# Patient Record
Sex: Female | Born: 1950 | Race: White | Hispanic: No | Marital: Married | Smoking: Never smoker
Health system: Southern US, Community
[De-identification: ages and names within clinical notes are randomized; demographics above are authoritative.]

## PROBLEM LIST (undated history)

## (undated) DIAGNOSIS — Z87442 Personal history of urinary calculi: Secondary | ICD-10-CM

## (undated) DIAGNOSIS — F909 Attention-deficit hyperactivity disorder, unspecified type: Secondary | ICD-10-CM

## (undated) DIAGNOSIS — J40 Bronchitis, not specified as acute or chronic: Secondary | ICD-10-CM

## (undated) DIAGNOSIS — H1010 Acute atopic conjunctivitis, unspecified eye: Secondary | ICD-10-CM

## (undated) DIAGNOSIS — I341 Nonrheumatic mitral (valve) prolapse: Secondary | ICD-10-CM

## (undated) DIAGNOSIS — J309 Allergic rhinitis, unspecified: Secondary | ICD-10-CM

## (undated) HISTORY — DX: Acute atopic conjunctivitis, unspecified eye: H10.10

## (undated) HISTORY — PX: CATARACT EXTRACTION, BILATERAL: SHX1313

## (undated) HISTORY — DX: Allergic rhinitis, unspecified: J30.9

## (undated) HISTORY — PX: TUBAL LIGATION: SHX77

---

## 2000-12-01 ENCOUNTER — Other Ambulatory Visit: Admission: RE | Admit: 2000-12-01 | Discharge: 2000-12-01 | Payer: Self-pay | Admitting: Internal Medicine

## 2001-11-24 ENCOUNTER — Encounter: Admission: RE | Admit: 2001-11-24 | Discharge: 2001-11-24 | Payer: Self-pay | Admitting: *Deleted

## 2001-12-22 ENCOUNTER — Encounter: Admission: RE | Admit: 2001-12-22 | Discharge: 2001-12-22 | Payer: Self-pay | Admitting: *Deleted

## 2002-01-08 ENCOUNTER — Other Ambulatory Visit: Admission: RE | Admit: 2002-01-08 | Discharge: 2002-01-08 | Payer: Self-pay | Admitting: Obstetrics and Gynecology

## 2002-01-15 ENCOUNTER — Encounter: Payer: Self-pay | Admitting: Obstetrics and Gynecology

## 2002-01-15 ENCOUNTER — Encounter: Admission: RE | Admit: 2002-01-15 | Discharge: 2002-01-15 | Payer: Self-pay | Admitting: Obstetrics and Gynecology

## 2002-03-23 ENCOUNTER — Encounter: Admission: RE | Admit: 2002-03-23 | Discharge: 2002-03-23 | Payer: Self-pay | Admitting: *Deleted

## 2002-07-11 ENCOUNTER — Encounter: Admission: RE | Admit: 2002-07-11 | Discharge: 2002-07-11 | Payer: Self-pay | Admitting: *Deleted

## 2003-04-29 ENCOUNTER — Encounter: Admission: RE | Admit: 2003-04-29 | Discharge: 2003-04-29 | Payer: Self-pay | Admitting: Obstetrics and Gynecology

## 2003-04-29 ENCOUNTER — Encounter: Payer: Self-pay | Admitting: Obstetrics and Gynecology

## 2004-03-30 ENCOUNTER — Ambulatory Visit (HOSPITAL_COMMUNITY): Admission: RE | Admit: 2004-03-30 | Discharge: 2004-03-30 | Payer: Self-pay | Admitting: Vascular Surgery

## 2004-03-30 ENCOUNTER — Encounter (INDEPENDENT_AMBULATORY_CARE_PROVIDER_SITE_OTHER): Payer: Self-pay | Admitting: *Deleted

## 2004-06-19 ENCOUNTER — Encounter: Admission: RE | Admit: 2004-06-19 | Discharge: 2004-06-19 | Payer: Self-pay | Admitting: Internal Medicine

## 2004-11-08 HISTORY — PX: OTHER SURGICAL HISTORY: SHX169

## 2004-11-23 ENCOUNTER — Other Ambulatory Visit: Admission: RE | Admit: 2004-11-23 | Discharge: 2004-11-23 | Payer: Self-pay | Admitting: Internal Medicine

## 2005-01-14 ENCOUNTER — Ambulatory Visit (HOSPITAL_COMMUNITY): Admission: RE | Admit: 2005-01-14 | Discharge: 2005-01-14 | Payer: Self-pay | Admitting: Gastroenterology

## 2005-10-26 ENCOUNTER — Encounter: Admission: RE | Admit: 2005-10-26 | Discharge: 2005-10-26 | Payer: Self-pay | Admitting: Internal Medicine

## 2006-04-06 ENCOUNTER — Ambulatory Visit (HOSPITAL_COMMUNITY): Payer: Self-pay | Admitting: *Deleted

## 2006-06-21 ENCOUNTER — Ambulatory Visit (HOSPITAL_COMMUNITY): Payer: Self-pay | Admitting: *Deleted

## 2006-10-27 ENCOUNTER — Ambulatory Visit (HOSPITAL_COMMUNITY): Payer: Self-pay | Admitting: *Deleted

## 2006-12-05 ENCOUNTER — Encounter: Admission: RE | Admit: 2006-12-05 | Discharge: 2007-03-05 | Payer: Self-pay | Admitting: Family Medicine

## 2008-04-09 ENCOUNTER — Encounter: Admission: RE | Admit: 2008-04-09 | Discharge: 2008-04-09 | Payer: Self-pay | Admitting: Obstetrics and Gynecology

## 2008-06-10 ENCOUNTER — Other Ambulatory Visit: Admission: RE | Admit: 2008-06-10 | Discharge: 2008-06-10 | Payer: Self-pay | Admitting: Family Medicine

## 2010-09-18 ENCOUNTER — Ambulatory Visit: Payer: Self-pay | Admitting: Diagnostic Radiology

## 2010-09-18 ENCOUNTER — Ambulatory Visit (HOSPITAL_BASED_OUTPATIENT_CLINIC_OR_DEPARTMENT_OTHER): Admission: RE | Admit: 2010-09-18 | Discharge: 2010-09-18 | Payer: Self-pay | Admitting: Family Medicine

## 2011-03-26 NOTE — Op Note (Signed)
[name], Alexandria Lee              ACCOUNT NO.:  [id number]   MEDICAL RECORD NO.:  [id number]          PATIENT TYPE:  AMB   LOCATION:  ENDO                         FACILITY:  [facility]   PHYSICIAN:  Danise Edge, M.D.   DATE OF BIRTH:  1951-08-02   DATE OF PROCEDURE:  01/14/2005  DATE OF DISCHARGE:                                 OPERATIVE REPORT   PROCEDURE:  Screening colonoscopy.   INDICATIONS FOR PROCEDURE:  Ms. Alexandria Lee is a 60 year old female born  March 04, 1951.  Alexandria Lee is scheduled to undergo her first screening  colonoscopy with polypectomy to prevent colon cancer.   ENDOSCOPIST:  Danise Edge, M.D.   PREMEDICATION:  Versed 5 mg, Demerol 50 mg.   DESCRIPTION OF PROCEDURE:  After obtaining informed consent, Alexandria Lee was  placed in the left lateral decubitus position.  I administered intravenous  Demerol and intravenous Versed to achieve conscious sedation for the  procedure.  The patient's blood pressure, oxygen saturation, and cardiac  rhythm were monitored throughout the procedure and documented in the medical  record.   Anal inspection and digital rectal exam were normal.  The Olympus adjustable  pediatric colonoscope was introduced into the rectum and advanced to the  cecum.  The colonic preparation for the exam today was excellent.   Rectum:  Normal.   Sigmoid colon and descending colon:  Normal.   Splenic flexure:  Normal.   Transverse colon:  Normal.   Hepatic flexure:  Normal.   Ascending colon:  Normal.   Cecum and ileocecal valve:  Normal.   ASSESSMENT:  Normal screening proctocolonoscopy to the cecum.  No endoscopic  evidence for the presence of colorectal neoplasia.      MJ/MEDQ  D:  01/14/2005  T:  01/14/2005  Job:  [id number]   cc:   Sharlet Salina, M.D.  [address]  [place]  [place] [address]  Fax: [phone]

## 2011-03-26 NOTE — Op Note (Signed)
NAME:  Alexandria Lee, Alexandria Lee                        ACCOUNT NO.:  [id number]   MEDICAL RECORD NO.:  [id number]                   PATIENT TYPE:  OIB   LOCATION:  2899                                 FACILITY:  [facility]   PHYSICIAN:  Larina Earthly, M.D.                 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   PROCEDURE:  Ligation and stripping of greater saphenous vein from the groin  to the below the knee position on the right and stab avulsion removal of  tributary varicosities on the calf.   SURGEON:  Larina Earthly, M.D.   ASSISTANT:  Toribio Harbour, N.P.   ANESTHESIA:  LMA.   COMPLICATIONS:  None .   DISPOSITION:  To the recovery room stable.   PROCEDURE IN DETAIL:  The patient was taken to the operating room and placed  in a supine position where the area of the right groin and right leg were  prepped and draped in the usual sterile fashion.  An incision was made over  the area of the saphenofemoral junction.  The incision was continued down to  the level of the saphenofemoral junction.  The tributary branches of the  saphenofemoral junction were removed with avulsion technique.  The saphenous  vein was ligated at the saphenofemoral junction.  A stripper was passed  retrograde down to the level of the below the knee position and a separate  incision made at this level and the stripper was removed through this area.  The saphenous vein was ligated distal to this.  The saphenous vein was left  intact on the calf.  Next, using an 11 blade, multiple stab incisions were  made over the calf and pretibial area for removal of the tributary  varicosities.  These were done with avulsion  technique.  Pressure was held  for hemostasis.  The saphenous vein was then removed from the below knee  position to the groin and pressure was held with hemostasis.  The wounds  were irrigated with saline.  The stab incisions were closed with 4-0  subcuticular Vicryl stitch.  The groin incisions were closed in several  layers with 3-0 subcutaneous and subcuticular Vicryl stitch.  Benzoin, Steri-  Strips, and a sterile dressing was applied.  The patient had a Coban  pressure dressing.  She was taken to the recovery room in stable condition.                                               Tawanna Cooler  Shirline Frees, M.D.    TFE/MEDQ  D:  03/30/2004  T:  03/30/2004  Job:  [id number]

## 2011-10-29 ENCOUNTER — Other Ambulatory Visit (HOSPITAL_COMMUNITY)
Admission: RE | Admit: 2011-10-29 | Discharge: 2011-10-29 | Disposition: A | Discharge status: Home / Self Care | Payer: BC Managed Care – PPO | Source: Ambulatory Visit | Attending: Family Medicine | Admitting: Family Medicine

## 2011-10-29 ENCOUNTER — Other Ambulatory Visit: Payer: Self-pay | Admitting: Family Medicine

## 2011-10-29 DIAGNOSIS — Z0000 Encounter for general adult medical examination without abnormal findings: Secondary | ICD-10-CM | POA: Insufficient documentation

## 2011-11-10 ENCOUNTER — Other Ambulatory Visit (HOSPITAL_BASED_OUTPATIENT_CLINIC_OR_DEPARTMENT_OTHER): Payer: Self-pay | Admitting: Family Medicine

## 2011-11-10 DIAGNOSIS — Z1231 Encounter for screening mammogram for malignant neoplasm of breast: Secondary | ICD-10-CM

## 2011-11-29 ENCOUNTER — Ambulatory Visit (HOSPITAL_BASED_OUTPATIENT_CLINIC_OR_DEPARTMENT_OTHER)
Admission: RE | Admit: 2011-11-29 | Discharge: 2011-11-29 | Disposition: A | Discharge status: Home / Self Care | Payer: BC Managed Care – PPO | Source: Ambulatory Visit | Attending: Family Medicine | Admitting: Family Medicine

## 2011-11-29 DIAGNOSIS — Z1231 Encounter for screening mammogram for malignant neoplasm of breast: Secondary | ICD-10-CM

## 2011-12-22 ENCOUNTER — Emergency Department (HOSPITAL_BASED_OUTPATIENT_CLINIC_OR_DEPARTMENT_OTHER)
Admission: EM | Admit: 2011-12-22 | Discharge: 2011-12-23 | Disposition: A | Discharge status: Home / Self Care | Payer: BC Managed Care – PPO | Attending: Emergency Medicine | Admitting: Emergency Medicine

## 2011-12-22 ENCOUNTER — Encounter (HOSPITAL_BASED_OUTPATIENT_CLINIC_OR_DEPARTMENT_OTHER): Payer: Self-pay | Admitting: Emergency Medicine

## 2011-12-22 DIAGNOSIS — S01112A Laceration without foreign body of left eyelid and periocular area, initial encounter: Secondary | ICD-10-CM

## 2011-12-22 DIAGNOSIS — W1809XA Striking against other object with subsequent fall, initial encounter: Secondary | ICD-10-CM | POA: Insufficient documentation

## 2011-12-22 DIAGNOSIS — Y92009 Unspecified place in unspecified non-institutional (private) residence as the place of occurrence of the external cause: Secondary | ICD-10-CM | POA: Insufficient documentation

## 2011-12-22 DIAGNOSIS — S0180XA Unspecified open wound of other part of head, initial encounter: Secondary | ICD-10-CM | POA: Insufficient documentation

## 2011-12-22 HISTORY — DX: Attention-deficit hyperactivity disorder, unspecified type: F90.9

## 2011-12-22 HISTORY — DX: Nonrheumatic mitral (valve) prolapse: I34.1

## 2011-12-22 NOTE — ED Notes (Signed)
Pt reports she was cleaning bathroom with pine sol and doesn't know exactly what happened. Pt states she stumbled into door way and hit head on door way. Pt has small laceration to left eye brow.

## 2011-12-23 NOTE — ED Provider Notes (Signed)
History     CSN: [id number]  Arrival date & time 12/22/11  2318   First MD Initiated Contact with Patient 12/22/11 2343      Chief Complaint  Patient presents with  . Head Laceration     HPI The patient presents immediately after suffering head,.  She notes that she was cleaning, slipped and fell striking her head against a cabinet.  No loss of consciousness, no visual complaints, no vomiting, no ataxia.  Since the event she said mild pain about the lateral aspect of her left brow.  No confusion, no disorientation, no other focal complaints. Past Medical History  Diagnosis Date  . Kidney stone   . Mitral valve prolapse   . ADD (attention deficit disorder with hyperactivity)     Past Surgical History  Procedure Date  . Tubal ligation     No family history on file.  History  Substance Use Topics  . Smoking status: Never Smoker   . Smokeless tobacco: Not on file  . Alcohol Use: No    OB History    Grav Para Term Preterm Abortions TAB SAB Ect Mult Living                  Review of Systems  All other systems reviewed and are negative.    Allergies  Review of patient's allergies indicates no known allergies.  Home Medications   Current Outpatient Rx  Name Route Sig Dispense Refill  . VITAMIN D 1000 UNITS PO TABS Oral Take 1,000 Units by mouth daily.    Marland Kitchen LORATADINE 10 MG PO TABS Oral Take 10 mg by mouth daily.    . METHYLPHENIDATE HCL ER (LA) 20 MG PO CP24 Oral Take 20 mg by mouth every morning.      BP 118/69  Pulse 78  Temp(Src) 97.7 F (36.5 C) (Oral)  Resp 18  Wt 126 lb (57.153 kg)  SpO2 98%  Physical Exam  Nursing note and vitals reviewed. Constitutional: She is oriented to person, place, and time. She appears well-developed and well-nourished.  HENT:  Head: Normocephalic. Head is with laceration. Head is without raccoon's eyes, without Battle's sign and without abrasion.    Eyes: Conjunctivae and EOM are normal. Pupils are equal, round, and  reactive to light.  Pulmonary/Chest: Effort normal. No stridor.  Musculoskeletal: She exhibits no edema.  Neurological: She is alert and oriented to person, place, and time. No cranial nerve deficit. She exhibits normal muscle tone. Coordination normal.  Skin: Skin is warm and dry.  Psychiatric: She has a normal mood and affect.    ED Course  LACERATION REPAIR Date/Time: 12/23/2011 12:05 AM Performed by: Gerhard Munch Authorized by: Gerhard Munch Consent: Verbal consent obtained. The procedure was performed in an emergent situation. Risks and benefits: risks, benefits and alternatives were discussed Patient identity confirmed: verbally with patient Time out: Immediately prior to procedure a "time out" was called to verify the correct patient, procedure, equipment, support staff and site/side marked as required. Body area: head/neck Location details: left eyebrow Laceration length: 3 cm Foreign bodies: no foreign bodies Tendon involvement: none Nerve involvement: none Vascular damage: no Patient sedated: no Irrigation solution: saline Irrigation method: Rinse. Amount of cleaning: standard Debridement: none Degree of undermining: none Skin closure: glue Technique: simple Approximation: close Approximation difficulty: simple Patient tolerance: Patient tolerated the procedure well with no immediate complications.   (including critical care time)  Labs Reviewed - No data to display No results found.   1.  Laceration of eyebrow, left       MDM  The patient presents following minor head, that resulted in a brow laceration.  The absence of other complaints, the absence of focal neurologic findings is reassuring for isolated skin lesion.  The patient was repaired as above.  Return precautions were provided.  The patient was discharged in stable condition.        Gerhard Munch, MD 12/23/11 [phone]

## 2011-12-23 NOTE — Discharge Instructions (Signed)
Facial Laceration °A facial laceration is a cut on the face. Lacerations usually heal quickly, but they need special care to reduce scarring. It will take 1 to 2 years for the scar to lose its redness and to heal completely. °TREATMENT  °Some facial lacerations may not require closure. Some lacerations may not be able to be closed due to an increased risk of infection. It is important to see your caregiver as soon as possible after an injury to minimize the risk of infection and to maximize the opportunity for successful closure. °If closure is appropriate, pain medicines may be given, if needed. The wound will be cleaned to help prevent infection. Your caregiver will use stitches (sutures), staples, wound glue (adhesive), or skin adhesive strips to repair the laceration. These tools bring the skin edges together to allow for faster healing and a better cosmetic outcome. However, all wounds will heal with a scar.  °Once the wound has healed, scarring can be minimized by covering the wound with sunscreen during the day for 1 full year. Use a sunscreen with an SPF of at least 30. Sunscreen helps to reduce the pigment that will form in the scar. When applying sunscreen to a completely healed wound, massage the scar for a few minutes to help reduce the appearance of the scar. Use circular motions with your fingertips, on and around the scar. Do not massage a healing wound. °HOME CARE INSTRUCTIONS °For sutures: °· Keep the wound clean and dry.  °· If you were given a bandage (dressing), you should change it at least once a day. Also change the dressing if it becomes wet or dirty, or as directed by your caregiver.  °· Wash the wound with soap and water 2 times a day. Rinse the wound off with water to remove all soap. Pat the wound dry with a clean towel.  °· After cleaning, apply a thin layer of the antibiotic ointment recommended by your caregiver. This will help prevent infection and keep the dressing from sticking.   °· You may shower as usual after the first 24 hours. Do not soak the wound in water until the sutures are removed.  °· Only take over-the-counter or prescription medicines for pain, discomfort, or fever as directed by your caregiver.  °· Get your sutures removed as directed by your caregiver. With facial lacerations, sutures should usually be taken out after 4 to 5 days to avoid stitch marks.  °· Wait a few days after your sutures are removed before applying makeup.  °For skin adhesive strips: °· Keep the wound clean and dry.  °· Do not get the skin adhesive strips wet. You may bathe carefully, using caution to keep the wound dry.  °· If the wound gets wet, pat it dry with a clean towel.  °· Skin adhesive strips will fall off on their own. You may trim the strips as the wound heals. Do not remove skin adhesive strips that are still stuck to the wound. They will fall off in time.  °For wound adhesive: °· You may briefly wet your wound in the shower or bath. Do not soak or scrub the wound. Do not swim. Avoid periods of heavy perspiration until the skin adhesive has fallen off on its own. After showering or bathing, gently pat the wound dry with a clean towel.  °· Do not apply liquid medicine, cream medicine, ointment medicine, or makeup to your wound while the skin adhesive is in place. This may loosen the film   before your wound is healed.  °· If a dressing is placed over the wound, be careful not to apply tape directly over the skin adhesive. This may cause the adhesive to be pulled off before the wound is healed.  °· Avoid prolonged exposure to sunlight or tanning lamps while the skin adhesive is in place. Exposure to ultraviolet light in the first year will darken the scar.  °· The skin adhesive will usually remain in place for 5 to 10 days, then naturally fall off the skin. Do not pick at the adhesive film.  °You may need a tetanus shot if: °· You cannot remember when you had your last tetanus shot.  °· You have  never had a tetanus shot.  °If you get a tetanus shot, your arm may swell, get red, and feel warm to the touch. This is common and not a problem. If you need a tetanus shot and you choose not to have one, there is a rare chance of getting tetanus. Sickness from tetanus can be serious. °SEEK IMMEDIATE MEDICAL CARE IF: °· You develop redness, pain, or swelling around the wound.  °· There is yellowish-white fluid (pus) coming from the wound.  °· You develop chills or a fever.  °MAKE SURE YOU: °· Understand these instructions.  °· Will watch your condition.  °· Will get help right away if you are not doing well or get worse.  °Document Released: 12/02/2004 Document Revised: 07/07/2011 Document Reviewed: 04/19/2011 °[employer]® Patient Information ©2012 [employer]. °

## 2012-10-25 ENCOUNTER — Other Ambulatory Visit (HOSPITAL_BASED_OUTPATIENT_CLINIC_OR_DEPARTMENT_OTHER): Payer: Self-pay | Admitting: Family Medicine

## 2012-10-25 DIAGNOSIS — Z1231 Encounter for screening mammogram for malignant neoplasm of breast: Secondary | ICD-10-CM

## 2012-11-29 ENCOUNTER — Ambulatory Visit (HOSPITAL_BASED_OUTPATIENT_CLINIC_OR_DEPARTMENT_OTHER)
Admission: RE | Admit: 2012-11-29 | Discharge: 2012-11-29 | Disposition: A | Discharge status: Home / Self Care | Payer: BC Managed Care – PPO | Source: Ambulatory Visit | Attending: Family Medicine | Admitting: Family Medicine

## 2012-11-29 DIAGNOSIS — Z1231 Encounter for screening mammogram for malignant neoplasm of breast: Secondary | ICD-10-CM | POA: Insufficient documentation

## 2013-11-26 ENCOUNTER — Other Ambulatory Visit: Payer: Self-pay | Admitting: Family Medicine

## 2013-11-26 ENCOUNTER — Other Ambulatory Visit (HOSPITAL_COMMUNITY)
Admission: RE | Admit: 2013-11-26 | Discharge: 2013-11-26 | Disposition: A | Discharge status: Home / Self Care | Payer: BC Managed Care – PPO | Source: Ambulatory Visit | Attending: Family Medicine | Admitting: Family Medicine

## 2013-11-26 DIAGNOSIS — Z0000 Encounter for general adult medical examination without abnormal findings: Secondary | ICD-10-CM | POA: Insufficient documentation

## 2014-11-11 ENCOUNTER — Other Ambulatory Visit (HOSPITAL_BASED_OUTPATIENT_CLINIC_OR_DEPARTMENT_OTHER): Payer: Self-pay | Admitting: Family Medicine

## 2014-11-11 DIAGNOSIS — Z1231 Encounter for screening mammogram for malignant neoplasm of breast: Secondary | ICD-10-CM

## 2014-11-25 ENCOUNTER — Ambulatory Visit (HOSPITAL_BASED_OUTPATIENT_CLINIC_OR_DEPARTMENT_OTHER)
Admission: RE | Admit: 2014-11-25 | Discharge: 2014-11-25 | Disposition: A | Discharge status: Home / Self Care | Payer: PRIVATE HEALTH INSURANCE | Source: Ambulatory Visit | Attending: Family Medicine | Admitting: Family Medicine

## 2014-11-25 DIAGNOSIS — Z1231 Encounter for screening mammogram for malignant neoplasm of breast: Secondary | ICD-10-CM | POA: Diagnosis not present

## 2016-02-24 ENCOUNTER — Other Ambulatory Visit: Payer: Self-pay | Admitting: Family Medicine

## 2016-02-24 ENCOUNTER — Other Ambulatory Visit (HOSPITAL_COMMUNITY)
Admission: RE | Admit: 2016-02-24 | Discharge: 2016-02-24 | Disposition: A | Discharge status: Home / Self Care | Payer: PRIVATE HEALTH INSURANCE | Source: Ambulatory Visit | Attending: Family Medicine | Admitting: Family Medicine

## 2016-02-24 DIAGNOSIS — Z124 Encounter for screening for malignant neoplasm of cervix: Secondary | ICD-10-CM | POA: Insufficient documentation

## 2016-02-25 LAB — CYTOLOGY - PAP

## 2016-03-19 ENCOUNTER — Other Ambulatory Visit: Payer: Self-pay | Admitting: Gastroenterology

## 2016-05-17 ENCOUNTER — Encounter (HOSPITAL_COMMUNITY): Payer: Self-pay | Admitting: *Deleted

## 2016-05-24 ENCOUNTER — Ambulatory Visit (HOSPITAL_COMMUNITY)
Admission: RE | Admit: 2016-05-24 | Payer: PRIVATE HEALTH INSURANCE | Source: Ambulatory Visit | Admitting: Gastroenterology

## 2016-05-24 HISTORY — DX: Bronchitis, not specified as acute or chronic: J40

## 2016-05-24 HISTORY — DX: Personal history of urinary calculi: Z87.442

## 2016-05-24 SURGERY — COLONOSCOPY WITH PROPOFOL
Anesthesia: Monitor Anesthesia Care

## 2016-07-09 ENCOUNTER — Other Ambulatory Visit: Payer: Self-pay | Admitting: Orthopedic Surgery

## 2016-10-14 ENCOUNTER — Other Ambulatory Visit (HOSPITAL_BASED_OUTPATIENT_CLINIC_OR_DEPARTMENT_OTHER): Payer: Self-pay | Admitting: Family Medicine

## 2016-10-14 DIAGNOSIS — Z1231 Encounter for screening mammogram for malignant neoplasm of breast: Secondary | ICD-10-CM

## 2016-11-23 ENCOUNTER — Ambulatory Visit (HOSPITAL_BASED_OUTPATIENT_CLINIC_OR_DEPARTMENT_OTHER)
Admission: RE | Admit: 2016-11-23 | Discharge: 2016-11-23 | Disposition: A | Discharge status: Home / Self Care | Payer: PRIVATE HEALTH INSURANCE | Source: Ambulatory Visit | Attending: Family Medicine | Admitting: Family Medicine

## 2016-11-23 DIAGNOSIS — Z1231 Encounter for screening mammogram for malignant neoplasm of breast: Secondary | ICD-10-CM | POA: Insufficient documentation

## 2017-10-14 ENCOUNTER — Other Ambulatory Visit (HOSPITAL_BASED_OUTPATIENT_CLINIC_OR_DEPARTMENT_OTHER): Payer: Self-pay | Admitting: Family Medicine

## 2017-10-14 DIAGNOSIS — Z1231 Encounter for screening mammogram for malignant neoplasm of breast: Secondary | ICD-10-CM

## 2017-11-28 ENCOUNTER — Ambulatory Visit (HOSPITAL_BASED_OUTPATIENT_CLINIC_OR_DEPARTMENT_OTHER)
Admission: RE | Admit: 2017-11-28 | Discharge: 2017-11-28 | Disposition: A | Discharge status: Home / Self Care | Payer: PRIVATE HEALTH INSURANCE | Source: Ambulatory Visit | Attending: Family Medicine | Admitting: Family Medicine

## 2017-11-28 DIAGNOSIS — Z1231 Encounter for screening mammogram for malignant neoplasm of breast: Secondary | ICD-10-CM | POA: Insufficient documentation

## 2018-03-22 ENCOUNTER — Ambulatory Visit (INDEPENDENT_AMBULATORY_CARE_PROVIDER_SITE_OTHER): Payer: PRIVATE HEALTH INSURANCE | Admitting: Allergy and Immunology

## 2018-03-22 ENCOUNTER — Encounter: Payer: Self-pay | Admitting: Allergy and Immunology

## 2018-03-22 VITALS — BP 138/82 | HR 88 | Temp 98.3°F | Resp 18 | Ht 65.0 in | Wt 128.0 lb

## 2018-03-22 DIAGNOSIS — H1010 Acute atopic conjunctivitis, unspecified eye: Secondary | ICD-10-CM | POA: Diagnosis not present

## 2018-03-22 DIAGNOSIS — L989 Disorder of the skin and subcutaneous tissue, unspecified: Secondary | ICD-10-CM

## 2018-03-22 DIAGNOSIS — J3089 Other allergic rhinitis: Secondary | ICD-10-CM | POA: Diagnosis not present

## 2018-03-22 DIAGNOSIS — Z8701 Personal history of pneumonia (recurrent): Secondary | ICD-10-CM

## 2018-03-22 DIAGNOSIS — Z8709 Personal history of other diseases of the respiratory system: Secondary | ICD-10-CM

## 2018-03-22 DIAGNOSIS — L308 Other specified dermatitis: Secondary | ICD-10-CM | POA: Diagnosis not present

## 2018-03-22 MED ORDER — METHYLPREDNISOLONE ACETATE 80 MG/ML IJ SUSP
80.0000 mg | Freq: Once | INTRAMUSCULAR | Status: AC
  Administered 2018-03-22: 80 mg via INTRAMUSCULAR

## 2018-03-22 MED ORDER — OLOPATADINE HCL 0.1 % OP SOLN: 1.0000 [drp] | Freq: Two times a day (BID) | OPHTHALMIC | 5 refills | Status: DC

## 2018-03-22 NOTE — Patient Instructions (Addendum)
  1.  Treat and prevent inflammation:   A.  OTC Nasacort 1 spray each nostril 1 time per day  B.  OTC hydrocortisone 1% ointment to skin twice a day  C.  Depo-Medrol 80 IM delivered in clinic today  2.  If needed:   A.  Nasal saline multiple times a day  B.  OTC antihistamine -Claritin/Zyrtec/Allegra  C.  Patanol -1 drop each eye twice a day  3.  Do not touch or rub eyes.  4.  Return to clinic in 3 weeks or earlier if problem

## 2018-03-22 NOTE — Progress Notes (Signed)
Dear Dr. Cliffton Asters,  Thank you for referring Alexandria Lee to the [facility] on 03/22/2018.   Below is a summation of this patient's evaluation and recommendations.  Thank you for your referral. I will keep you informed about this patient's response to treatment.   If you have any questions please do not hesitate to contact me.   Sincerely,  Jessica Priest, MD Allergy / Immunology [facility]   ______________________________________________________________________    NEW PATIENT NOTE  Referring Provider: Laurann Montana, MD Primary Provider: Laurann Montana, MD Date of office visit: 03/22/2018    Subjective:   Chief Complaint:  Alexandria Lee (DOB: 09/13/1951) is a 67 y.o. female who presents to the clinic on 03/22/2018 with a chief complaint of Allergic Rhinitis  .     HPI: [name] presents to this clinic in evaluation of respiratory tract symptoms.  Apparently I had seen her in this clinic over 10 years ago.  She relates a history of developing a "pneumonia" in February 2019 with fever and coughing.  About 2 or 3 weeks after that event she still remained with significant respiratory tract symptoms especially severe nasal congestion and ugly nasal discharge and mouth breathing and coughing and complete anosmia.  She was apparently treated with an antibiotic in February which sounds as though it was Augmentin and an antibiotic in March which sounds as though it was azithromycin.  Presently she has nasal congestion and can smell but she has no ugly nasal discharge and she no longer has any cough and overall feels pretty good.  She does have some itchy eyes especially on the inner part of her eye and she is rubbing her eye consistently.  She did have a history of asthma decades ago but she has not had any exercise-induced bronchospastic symptoms or cold air induced bronchospastic symptoms  nor a requirement for a bronchodilator in years.  Apparently when I saw her in this clinic last her asthma and her allergic rhinitis were successfully treated with allergen avoidance measures directed against perennial and annual allergens and she has not really required any medications for decades.  She does not have a history of recurrent infections.  Past Medical History:  Diagnosis Date  . ADD (attention deficit disorder with hyperactivity)   . Bronchitis    CLEAR AND OCC YELLOW STARTED Z PACK 05-16-16 X 5 DAYS  . History of kidney stones YRS AGO  . Mitral valve prolapse    MILD, NO CARDIOLOGIST    Past Surgical History:  Procedure Laterality Date  . CATARACT EXTRACTION, BILATERAL    . RIGHT LEG VARICOSE VEIN SURGEYR  2006  . TUBAL LIGATION      Allergies as of 03/22/2018      Reactions   Adderall [amphetamine-dextroamphetamine] Other (See Comments)   "weight loss", mood changes.       Medication List      Calcium 600-200 MG-UNIT tablet Take 1 tablet by mouth daily.   cholecalciferol 1000 units tablet Commonly known as:  VITAMIN D Take 1,000 Units by mouth daily.   fexofenadine 180 MG tablet Commonly known as:  ALLEGRA Take 180 mg by mouth daily.       Review of systems negative except as noted in HPI / PMHx or noted below:  Review of Systems  Constitutional: Negative.   HENT: Negative.   Eyes: Negative.   Respiratory: Negative.   Cardiovascular: Negative.  Gastrointestinal: Negative.   Genitourinary: Negative.   Musculoskeletal: Negative.   Skin: Negative.   Neurological: Negative.   Endo/Heme/Allergies: Negative.   Psychiatric/Behavioral: Negative.     History reviewed. No pertinent family history.  Social History   Socioeconomic History  . Marital status: Married    Spouse name: Not on file  . Number of children: Not on file  . Years of education: Not on file  . Highest education level: Not on file  Occupational History  . Not on file    Social Needs  . Financial resource strain: Not on file  . Food insecurity:    Worry: Not on file    Inability: Not on file  . Transportation needs:    Medical: Not on file    Non-medical: Not on file  Tobacco Use  . Smoking status: Never Smoker  . Smokeless tobacco: Never Used  Substance and Sexual Activity  . Alcohol use: No  . Drug use: No  . Sexual activity: Not on file  Lifestyle  . Physical activity:    Days per week: Not on file    Minutes per session: Not on file  . Stress: Not on file  Relationships  . Social connections:    Talks on phone: Not on file    Gets together: Not on file    Attends religious service: Not on file    Active member of club or organization: Not on file    Attends meetings of clubs or organizations: Not on file    Relationship status: Not on file  . Intimate partner violence:    Fear of current or ex partner: Not on file    Emotionally abused: Not on file    Physically abused: Not on file    Forced sexual activity: Not on file  Other Topics Concern  . Not on file  Social History Narrative  . Not on file    Environmental and Social history  Lives in a house with a dry environment, no animals inside the household, no carpet in the bedroom, plastic on the bed, plastic on the pillow, and no smoking ongoing with inside the household.  Objective:   Vitals:   03/22/18 1339  BP: 138/82  Pulse: 88  Resp: 18  Temp: 98.3 F (36.8 C)   Height:  (165.1 cm) Weight: 128 lb (58.1 kg)  Physical Exam  HENT:  Head: Normocephalic. Head is without right periorbital erythema and without left periorbital erythema.  Right Ear: Tympanic membrane, external ear and ear canal normal.  Left Ear: Tympanic membrane, external ear and ear canal normal.  Nose: Nose normal. No mucosal edema or rhinorrhea.  Mouth/Throat: Oropharynx is clear and moist and mucous membranes are normal. No oropharyngeal exudate.  Eyes: Pupils are equal, round, and reactive  to light. Conjunctivae and lids are normal.  Neck: Trachea normal. No tracheal deviation present. No thyromegaly present.  Cardiovascular: Normal rate, regular rhythm, S1 normal, S2 normal and normal heart sounds.  No murmur heard. Pulmonary/Chest: Effort normal. No stridor. No respiratory distress. She has no wheezes. She has no rales. She exhibits no tenderness.  Abdominal: Soft. She exhibits no distension and no mass. There is no hepatosplenomegaly. There is no tenderness. There is no rebound and no guarding.  Musculoskeletal: She exhibits no edema or tenderness.  Lymphadenopathy:       Head (right side): No tonsillar adenopathy present.       Head (left side): No tonsillar adenopathy present.  She has no cervical adenopathy.    She has no axillary adenopathy.  Neurological: She is alert.  Skin: Rash (Erythematous indurated scaly skin superior nasolabial fold) noted. She is not diaphoretic. No erythema. No pallor. Nails show no clubbing.    Diagnostics: Allergy skin tests were not performed.   Spirometry was performed and demonstrated an FEV1 of 1.69 @ 68 % of predicted. FEV1/FVC = 0.74  Assessment and Plan:      1. Other allergic rhinitis   2. Seasonal allergic conjunctivitis   3. History of pneumonia   4. History of asthma   5. Inflammatory dermatosis     1.  Treat and prevent inflammation:   A.  OTC Nasacort 1 spray each nostril 1 time per day  B.  OTC hydrocortisone 1% ointment to skin twice a day  C.  Depo-Medrol 80 IM delivered in clinic today  2.  If needed:   A.  Nasal saline multiple times a day  B.  OTC antihistamine -Claritin/Zyrtec/Allegra  C.  Patanol -1 drop each eye twice a day  3.  Do not touch or rub eyes.  4.  Return to clinic in 3 weeks or earlier if problem  I will assume that Adie developed significant inflammation of her airway and her skin with the event that occurred over the course of the past few months and we will treat her with  anti-inflammatory agents for both organ systems as noted above and I will see her back in this clinic in 3 weeks to make a decision about further evaluation and treatment based upon her response.  It should be noted that her history of asthma and allergic rhinoconjunctivitis was relatively quiesced sent for over a decade until this event occurred.  She will contact me during the interval should there be a significant problem.  Jessica Priest, MD Allergy / Immunology [facility]

## 2018-03-23 ENCOUNTER — Encounter: Payer: Self-pay | Admitting: Allergy and Immunology

## 2018-04-12 ENCOUNTER — Ambulatory Visit: Payer: PRIVATE HEALTH INSURANCE | Admitting: Allergy and Immunology

## 2018-04-12 ENCOUNTER — Telehealth: Payer: Self-pay | Admitting: Allergy and Immunology

## 2018-04-12 ENCOUNTER — Encounter: Payer: Self-pay | Admitting: Allergy and Immunology

## 2018-04-12 VITALS — BP 112/80 | HR 100 | Resp 18

## 2018-04-12 DIAGNOSIS — J3089 Other allergic rhinitis: Secondary | ICD-10-CM

## 2018-04-12 DIAGNOSIS — H1010 Acute atopic conjunctivitis, unspecified eye: Secondary | ICD-10-CM

## 2018-04-12 NOTE — Patient Instructions (Addendum)
   1.  If needed:   A.  Nasal saline multiple times a day  B.  OTC antihistamine -Claritin/Zyrtec/Allegra  C.  Patanol -1 drop each eye twice a day  2.  Do not touch or rub eyes.  3. Follow up in clinic if needed.

## 2018-04-12 NOTE — Progress Notes (Signed)
Follow-up Note  Referring Provider: Laurann Lee, Cynthia, MD Primary Provider: Laurann Lee, Cynthia, MD Date of Office Visit: 04/12/2018  Subjective:   Alexandria Lee (DOB: 1951-01-24) is a 67 y.o. female who [facility] in re-evaluation of the following:  HPI: Alexandria DavenportSandra returns to this clinic in reevaluation of her respiratory tract and eye flareup addressed during her initial evaluation of 12 Mar 2018.  She is doing great.  She has no upper respiratory tract symptoms.  She has no problems with her eyes.  She does not need to rub her eyes.  She has been using Patanol and Allegra.  Allergies as of 04/12/2018      Reactions   Adderall [amphetamine-dextroamphetamine] Other (See Comments)   "weight loss", mood changes.       Medication List      Calcium 600-200 MG-UNIT tablet Take 1 tablet by mouth daily.   cholecalciferol 1000 units tablet Commonly known as:  VITAMIN D Take 1,000 Units by mouth daily.   fexofenadine 180 MG tablet Commonly known as:  ALLEGRA Take 180 mg by mouth daily.   NASAL SALINE NA Place into the nose.   olopatadine 0.1 % ophthalmic solution Commonly known as:  PATANOL Place 1 drop into both eyes 2 (two) times daily.       Past Medical History:  Diagnosis Date  . ADD (attention deficit disorder with hyperactivity)   . Allergic rhinoconjunctivitis   . Bronchitis    CLEAR AND OCC YELLOW STARTED Z PACK 05-16-16 X 5 DAYS  . History of kidney stones YRS AGO  . Mitral valve prolapse    MILD, NO CARDIOLOGIST    Past Surgical History:  Procedure Laterality Date  . CATARACT EXTRACTION, BILATERAL    . RIGHT LEG VARICOSE VEIN SURGEYR  2006  . TUBAL LIGATION      Review of systems negative except as noted in HPI / PMHx or noted below:  Review of Systems  Constitutional: Negative.   HENT: Negative.   Eyes: Negative.   Respiratory: Negative.   Cardiovascular: Negative.   Gastrointestinal: Negative.     Genitourinary: Negative.   Musculoskeletal: Negative.   Skin: Negative.   Neurological: Negative.   Endo/Heme/Allergies: Negative.   Psychiatric/Behavioral: Negative.      Objective:   Vitals:   04/12/18 1455  BP: 112/80  Pulse: 100  Resp: 18          Physical Exam  HENT:  Head: Normocephalic.  Right Ear: Tympanic membrane, external ear and ear canal normal.  Left Ear: Tympanic membrane, external ear and ear canal normal.  Nose: Nose normal. No mucosal edema or rhinorrhea.  Mouth/Throat: Uvula is midline, oropharynx is clear and moist and mucous membranes are normal. No oropharyngeal exudate.  Eyes: Conjunctivae are normal.  Neck: Trachea normal. No tracheal tenderness present. No tracheal deviation present. No thyromegaly present.  Cardiovascular: Normal rate, regular rhythm, S1 normal, S2 normal and normal heart sounds.  No murmur heard. Pulmonary/Chest: Breath sounds normal. No stridor. No respiratory distress. She has no wheezes. She has no rales.  Musculoskeletal: She exhibits no edema.  Lymphadenopathy:       Head (right side): No tonsillar adenopathy present.       Head (left side): No tonsillar adenopathy present.    She has no cervical adenopathy.  Neurological: She is alert.  Skin: No rash noted. She is not diaphoretic. No erythema. Nails show no clubbing.    Diagnostics: none  Assessment and Plan:   1. Other allergic rhinitis   2. Seasonal allergic conjunctivitis     1.  If needed:   A.  Nasal saline multiple times a day  B.  OTC antihistamine -Claritin/Zyrtec/Allegra  C.  Patanol -1 drop each eye twice a day  2.  Do not touch or rub eyes.  3. Follow up in clinic if needed.   Alexandria Lee appears to be doing much better at this point.  I will assume that she is over her flareup and she will not require much attention to her condition at this point other than the use of Patanol and antihistamine as needed.  The event that occurred earlier this year was  very rare and unique and I will make the assumption that it will not be a recurrent event.  She is welcome to follow-up in this clinic as needed.  Alexandria Schimke, MD Allergy / Immunology [facility]

## 2018-04-12 NOTE — Telephone Encounter (Signed)
Alexandria Lee came in for an appointment andDois Dav[facility]port [facility] [employer] was denying paying anything bc we didn't file with her primary insurance.  Her primary insurance in [employer] and secondary is [employer].  I have a copy of the EOB if needed.

## 2018-04-13 ENCOUNTER — Encounter: Payer: Self-pay | Admitting: Allergy and Immunology

## 2018-04-18 NOTE — Telephone Encounter (Signed)
Called pt to let her know that we have filed her [employer] - kt

## 2018-08-24 ENCOUNTER — Other Ambulatory Visit: Payer: Self-pay | Admitting: Allergy and Immunology

## 2019-08-01 ENCOUNTER — Other Ambulatory Visit (HOSPITAL_BASED_OUTPATIENT_CLINIC_OR_DEPARTMENT_OTHER): Payer: Self-pay | Admitting: Family Medicine

## 2019-08-01 ENCOUNTER — Other Ambulatory Visit: Payer: Self-pay | Admitting: Family Medicine

## 2019-08-01 ENCOUNTER — Other Ambulatory Visit (HOSPITAL_COMMUNITY)
Admission: RE | Admit: 2019-08-01 | Discharge: 2019-08-01 | Disposition: A | Discharge status: Home / Self Care | Payer: Managed Care, Other (non HMO) | Source: Ambulatory Visit | Attending: Family Medicine | Admitting: Family Medicine

## 2019-08-01 DIAGNOSIS — Z1231 Encounter for screening mammogram for malignant neoplasm of breast: Secondary | ICD-10-CM

## 2019-08-01 DIAGNOSIS — Z124 Encounter for screening for malignant neoplasm of cervix: Secondary | ICD-10-CM | POA: Insufficient documentation

## 2019-08-03 LAB — CYTOLOGY - PAP: Diagnosis: NEGATIVE

## 2019-08-10 ENCOUNTER — Ambulatory Visit (HOSPITAL_BASED_OUTPATIENT_CLINIC_OR_DEPARTMENT_OTHER)
Admission: RE | Admit: 2019-08-10 | Discharge: 2019-08-10 | Disposition: A | Discharge status: Home / Self Care | Payer: Managed Care, Other (non HMO) | Source: Ambulatory Visit | Attending: Family Medicine | Admitting: Family Medicine

## 2019-08-10 ENCOUNTER — Other Ambulatory Visit: Payer: Self-pay

## 2019-08-10 DIAGNOSIS — Z1231 Encounter for screening mammogram for malignant neoplasm of breast: Secondary | ICD-10-CM

## 2020-08-06 ENCOUNTER — Other Ambulatory Visit (HOSPITAL_BASED_OUTPATIENT_CLINIC_OR_DEPARTMENT_OTHER): Payer: Self-pay | Admitting: Family Medicine

## 2020-08-06 DIAGNOSIS — Z1231 Encounter for screening mammogram for malignant neoplasm of breast: Secondary | ICD-10-CM

## 2020-08-06 DIAGNOSIS — Z13820 Encounter for screening for osteoporosis: Secondary | ICD-10-CM

## 2020-08-26 ENCOUNTER — Ambulatory Visit (HOSPITAL_BASED_OUTPATIENT_CLINIC_OR_DEPARTMENT_OTHER): Payer: PRIVATE HEALTH INSURANCE

## 2020-10-20 ENCOUNTER — Ambulatory Visit (HOSPITAL_BASED_OUTPATIENT_CLINIC_OR_DEPARTMENT_OTHER)
Admission: RE | Admit: 2020-10-20 | Discharge: 2020-10-20 | Disposition: A | Discharge status: Home / Self Care | Payer: 59 | Source: Ambulatory Visit | Attending: Family Medicine | Admitting: Family Medicine

## 2020-10-20 ENCOUNTER — Other Ambulatory Visit: Payer: Self-pay

## 2020-10-20 ENCOUNTER — Encounter (HOSPITAL_BASED_OUTPATIENT_CLINIC_OR_DEPARTMENT_OTHER): Payer: Self-pay

## 2020-10-20 DIAGNOSIS — Z13820 Encounter for screening for osteoporosis: Secondary | ICD-10-CM | POA: Diagnosis present

## 2020-10-20 DIAGNOSIS — Z1231 Encounter for screening mammogram for malignant neoplasm of breast: Secondary | ICD-10-CM | POA: Diagnosis present

## 2020-10-21 ENCOUNTER — Ambulatory Visit (HOSPITAL_BASED_OUTPATIENT_CLINIC_OR_DEPARTMENT_OTHER): Payer: PRIVATE HEALTH INSURANCE

## 2020-10-21 ENCOUNTER — Other Ambulatory Visit (HOSPITAL_BASED_OUTPATIENT_CLINIC_OR_DEPARTMENT_OTHER): Payer: PRIVATE HEALTH INSURANCE

## 2021-11-24 IMAGING — MG DIGITAL SCREENING BILAT W/ TOMO W/ CAD
8 series · 8 of 24 positions shown · non-contrast
Comparison: Previous exam(s).

CLINICAL DATA: Screening.

EXAM:
DIGITAL SCREENING BILATERAL MAMMOGRAM WITH TOMO AND CAD

[R MLO synth-2D]
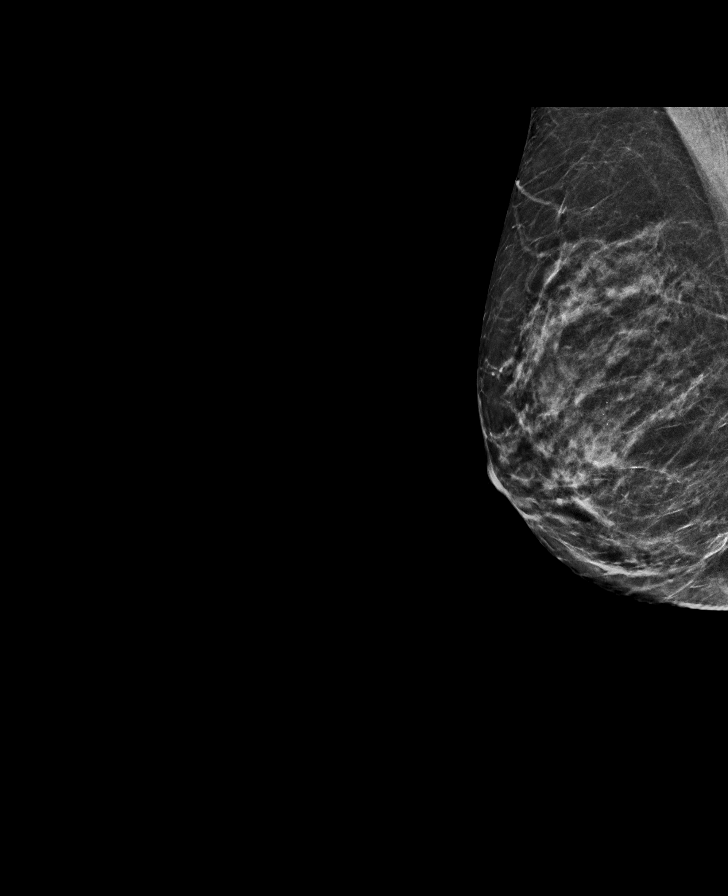

[L CC synth-2D]
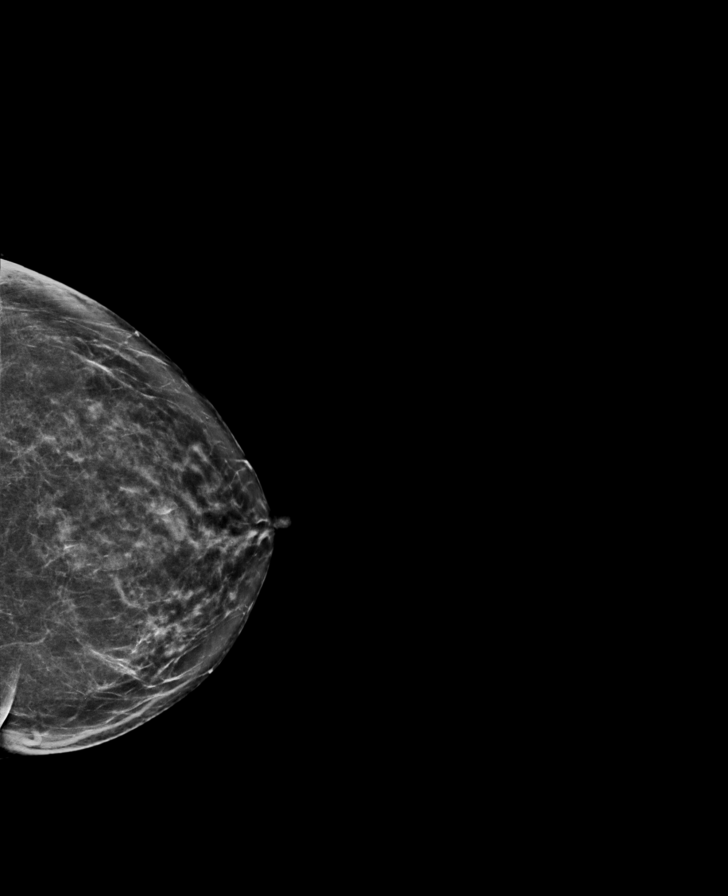

[R CC synth-2D]
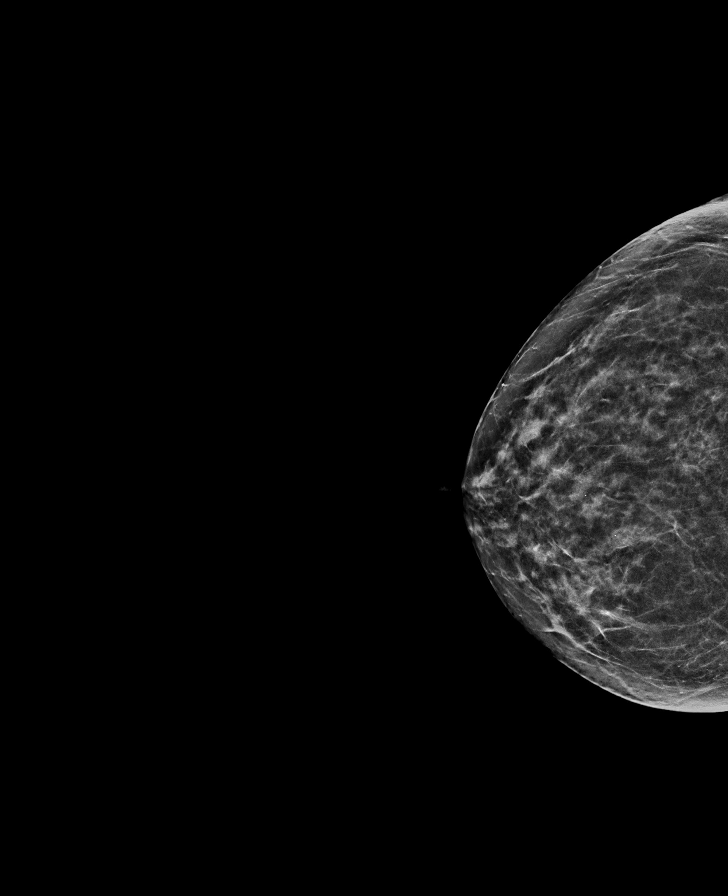

[L MLO synth-2D]
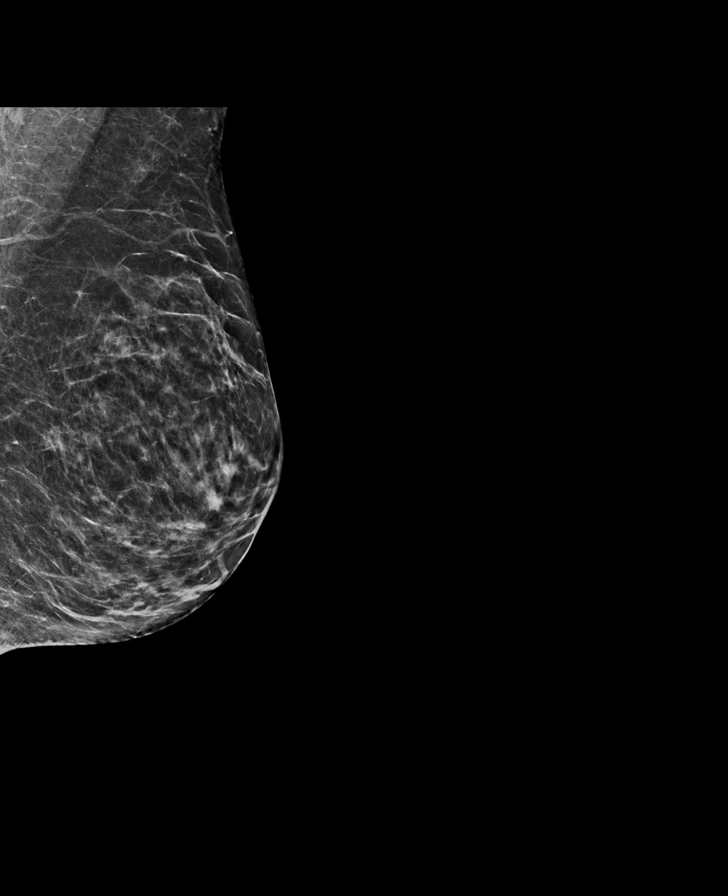

[R CC tomo · tomo slice 31/60.0]
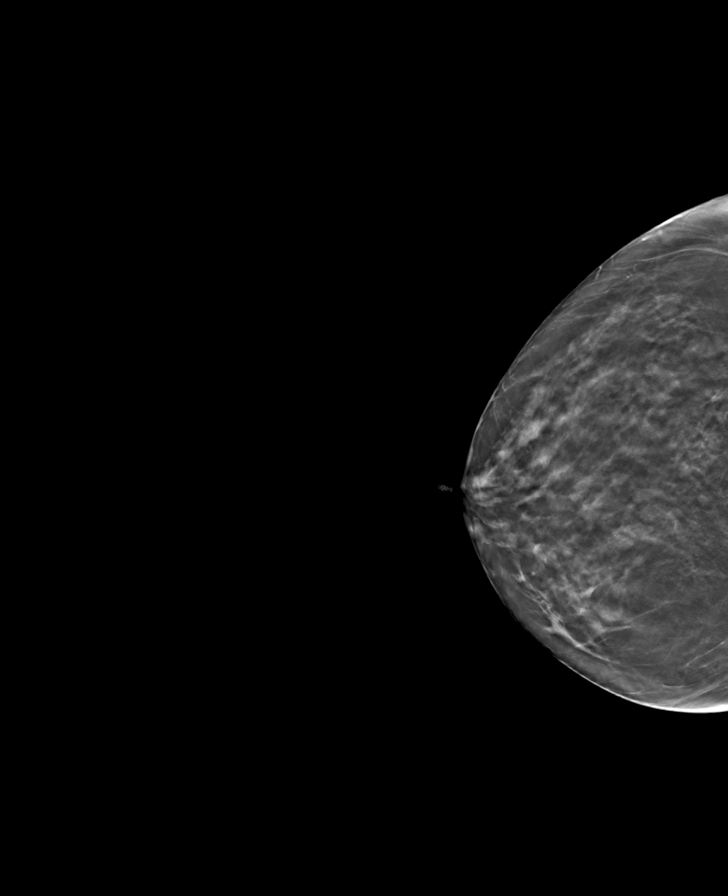

[L MLO tomo · tomo slice 31/60.0]
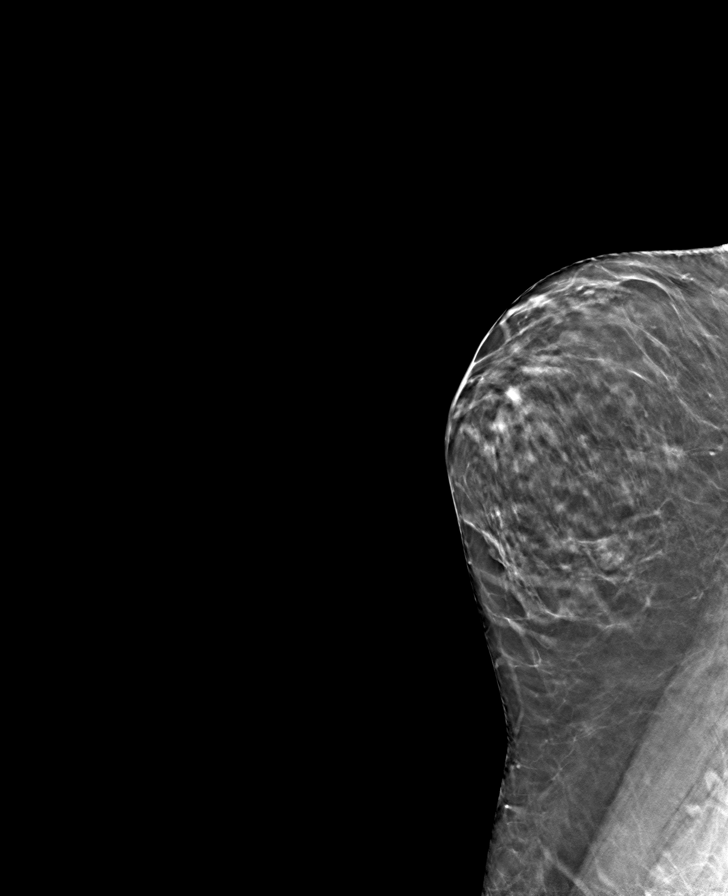

[L CC tomo · tomo slice 33/64.0]
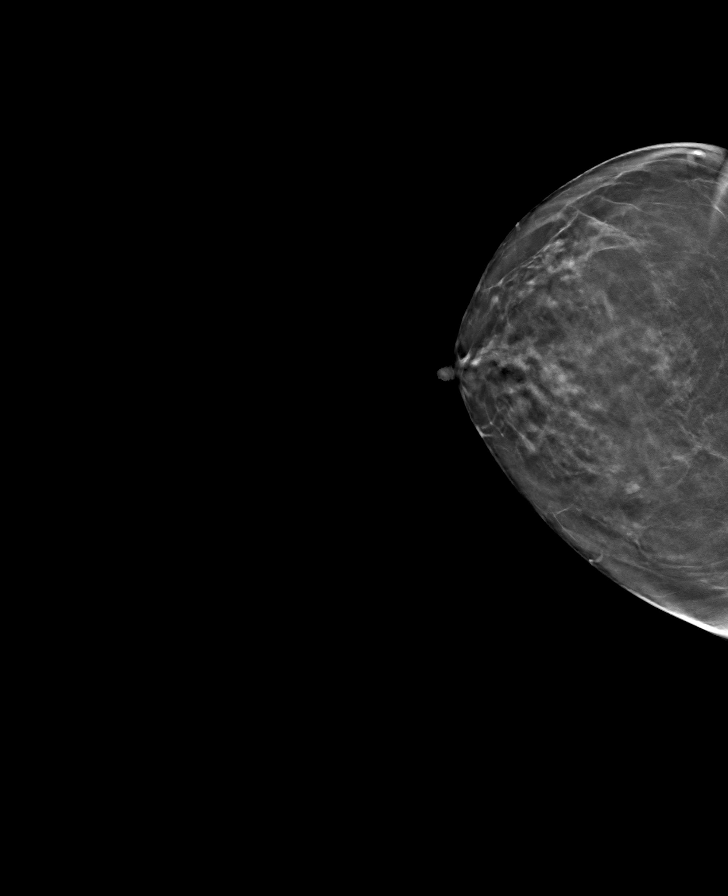

[R MLO tomo · tomo slice 29/58.0]
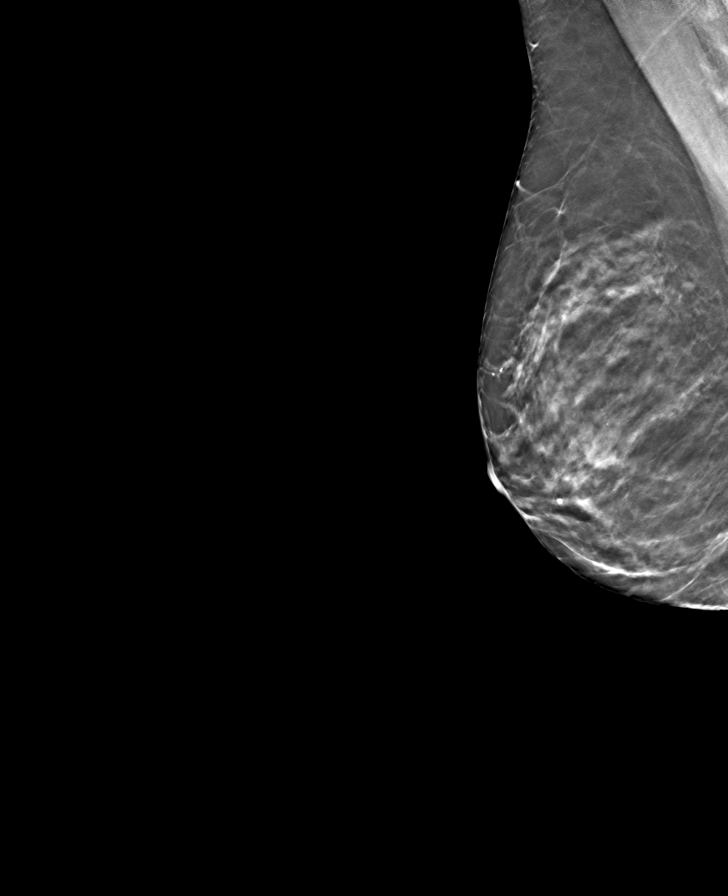

[8 of 24 positions shown; findings below may reference images not displayed]

ACR Breast Density Category c: The breast tissue is heterogeneously
dense, which may obscure small masses.
FINDINGS: There are no findings suspicious for malignancy. Images were
processed with CAD.
IMPRESSION: No mammographic evidence of malignancy. A result letter of this
screening mammogram will be mailed directly to the patient.

RECOMMENDATION:
Screening mammogram in one year. (Code:FT-U-LHB)

BI-RADS CATEGORY  1: Negative.

## 2022-02-14 ENCOUNTER — Emergency Department (HOSPITAL_COMMUNITY)
Admission: EM | Admit: 2022-02-14 | Discharge: 2022-02-14 | Disposition: A | Discharge status: Home / Self Care | Payer: 59 | Attending: Emergency Medicine | Admitting: Emergency Medicine

## 2022-02-14 ENCOUNTER — Other Ambulatory Visit: Payer: Self-pay

## 2022-02-14 DIAGNOSIS — H1033 Unspecified acute conjunctivitis, bilateral: Secondary | ICD-10-CM | POA: Diagnosis not present

## 2022-02-14 DIAGNOSIS — H5711 Ocular pain, right eye: Secondary | ICD-10-CM | POA: Diagnosis present

## 2022-02-14 DIAGNOSIS — B9689 Other specified bacterial agents as the cause of diseases classified elsewhere: Secondary | ICD-10-CM

## 2022-02-14 DIAGNOSIS — J302 Other seasonal allergic rhinitis: Secondary | ICD-10-CM

## 2022-02-14 MED ORDER — ERYTHROMYCIN 5 MG/GM OP OINT: TOPICAL_OINTMENT | OPHTHALMIC | 0 refills | Status: DC

## 2022-02-14 MED ORDER — ERYTHROMYCIN 5 MG/GM OP OINT: TOPICAL_OINTMENT | OPHTHALMIC | 0 refills | Status: AC

## 2022-02-14 NOTE — ED Triage Notes (Signed)
Pt from home states she has been on prescription eye drops for a while for her allergies.  Last week got a refill and suspects it is a different manufacturer and has had increasing redness, itching, pain, and drainage to both eyes.   ?

## 2022-02-14 NOTE — Discharge Instructions (Addendum)
Continue with your follow-up appointment with ophthalmology as planned for follow up and re-evaluation.   If you're unable to follow up with them, you may follow up with your PCP. ? ?A prescription provided for you that is a topical antibiotic ointment.  You may fill this prescription and apply this as directed. ? ?For new or worsening symptoms, return to the ED as discussed ? ?

## 2022-02-14 NOTE — ED Provider Notes (Signed)
?[facility]-EMERGENCY DEPT ?Provider Note ? ? ?CSN: [id number] ?Arrival date & time: 02/14/22  2025 ? ?  ? ?History ? ?Chief Complaint  ?Patient presents with  ?? Eye Problem  ? ? ?Alexandria Lee is a 71 y.o. female with chief complaint of eye pain and discharge that started few days ago.  Patient regularly takes olopatadine eyedrops for allergies around this time of year.  Patient states she started using a new eyedrop bottle about a week ago, and a few days later noticed symptoms.  First noticed discharge, started as clear and became thick and purulent.  Notes her eyes feel as if they are burning, red, painful, and itchy.  Has never had this before.  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  Denies recent fever, upper respiratory infection, illness, vision changes, or neurodeficits.  Does not wear contact lenses. ? ?The history is provided by the patient and medical records.  ?Eye Problem ?Associated symptoms: discharge, itching and redness   ?Associated symptoms: no photophobia   ? ?  ? ?Home Medications ?Prior to Admission medications   ?Medication Sig Start Date End Date Taking? Authorizing Provider  ?Calcium 600-200 MG-UNIT tablet Take 1 tablet by mouth daily.    [provider]  ?cholecalciferol (VITAMIN D) 1000 UNITS tablet Take 1,000 Units by mouth daily.    [provider]  ?erythromycin ophthalmic ointment Place a 1/2 inch ribbon of ointment into the lower eyelid. 02/14/22   Cecil Cobbsockerham, Deona Novitski M, PA-C  ?fexofenadine (ALLEGRA) 180 MG tablet Take 180 mg by mouth daily.    [provider]  ?NASAL SALINE NA Place into the nose.    [provider]  ?olopatadine (PATANOL) 0.1 % ophthalmic solution instill 1 drop in both eyes 2 times daily 08/24/18   Kozlow, Alvira PhilipsEric J, MD  ?   ? ?Allergies    ?Adderall [amphetamine-dextroamphetamine]   ? ?Review of Systems   ?Review of Systems  ?Eyes:  Positive for pain, discharge, redness and itching.  Negative for photophobia and visual disturbance.  ? ?Physical Exam ?Updated Vital Signs ?BP (!) 159/83 (BP Location: Right Arm)   Pulse 93   Temp 98 ?F (36.7 ?C) (Oral)   Resp 18   Ht 5\' 7"  (1.702 m)   Wt 59 kg   SpO2 100%   BMI 20.36 kg/m?  ?Physical Exam ?Vitals and nursing note reviewed.  ?Constitutional:   ?   General: She is not in acute distress. ?   Appearance: Normal appearance. She is well-developed. She is not ill-appearing or diaphoretic.  ?HENT:  ?   Head: Normocephalic and atraumatic.  ?   Nose: Nose normal.  ?Eyes:  ?   General: Lids are normal. Vision grossly intact. No visual field deficit.    ?   Right eye: Discharge present. No foreign body or hordeolum.     ?   Left eye: Discharge present.No foreign body or hordeolum.  ?   Extraocular Movements: Extraocular movements intact.  ?   Right eye: Normal extraocular motion and no nystagmus.  ?   Left eye: Normal extraocular motion and no nystagmus.  ?   Conjunctiva/sclera:  ?   Right eye: Right conjunctiva is injected. Exudate present. No chemosis or hemorrhage. ?   Left eye: Left conjunctiva is injected. Exudate present. No chemosis or hemorrhage. ?   Pupils: Pupils are equal, round, and reactive to light.  ?   Visual Fields: Right eye visual fields normal  and left eye visual fields normal.  ?   Comments: Pre-existing poor vision of the right eye ?Left eye worse purulent discharge than right  ?Cardiovascular:  ?   Rate and Rhythm: Normal rate and regular rhythm.  ?Pulmonary:  ?   Effort: Pulmonary effort is normal. No respiratory distress.  ?Abdominal:  ?   Palpations: Abdomen is soft.  ?Musculoskeletal:     ?   General: No swelling.  ?   Cervical back: Neck supple.  ?Skin: ?   General: Skin is warm and dry.  ?   Capillary Refill: Capillary refill takes less than 2 seconds.  ?Neurological:  ?   Mental Status: She is alert and oriented to person, place, and time.  ?Psychiatric:     ?   Mood and Affect: Mood normal.  ? ? ?ED Results / Procedures /  Treatments   ?Labs ?(all labs ordered are listed, but only abnormal results are displayed) ?Labs Reviewed - No data to display ? ?EKG ?None ? ?Radiology ?No results found. ? ?Procedures ?Procedures  ? ? ?Medications Ordered in ED ?Medications - No data to display ? ?ED Course/ Medical Decision Making/ A&P ?  ?                        ?Medical Decision Making ?Amount and/or Complexity of Data Reviewed ?External Data Reviewed: notes. ?Labs:  Decision-making details documented in ED Course. ?Radiology:  Decision-making details documented in ED Course. ?ECG/medicine tests:  Decision-making details documented in ED Course. ? ?Risk ?OTC drugs. ?Prescription drug management. ? ? ?71 y.o. female presents to the ED for concern of Eye Problem.  This involves an extensive number of treatment options, and is a complaint that carries with it a high risk of complications and morbidity.  The differential diagnosis includes bacterial conjunctivitis, viral conjunctivitis, allergic conjunctivitis ? ?Comorbidities that complicate the patient evaluation include allergic rhinoconjunctivitis, ADD, mitral valve prolapse, hx of bilateral cataract surgery ? ?Additional history obtained from internal/external records available via epic ? ?Interpretation: ?Test Considered: None ?  ?Critical Interventions: None ?  ?Consultations Obtained: None ? ?Intervention/ED Course: ?Pt presenting with eye discharge bilaterally.  Discharge is purulent, with more from the left eye than the right.  Conjunctiva injected and mildly erythematous bilaterally.  Not suspicious of focal viral conjunctivitis due to presence of purulent discharge, which is a hallmark of bacterial conjunctivitis.  Pt has pre-existing poor vision in right eye.  Visual acuity screening matches this.  Denies vision changes since onset of symptoms.  Physical exam not suspicious for neuro deficits or acute eye trauma or other emergent eye conditions.  No hordeolum or stye appreciated on  exam.  Afebrile and stable appearing.  Pt states eyes become irritated around this time of year, and maintains symptoms with Olopatadine drops.  Recently switched eye drop containers/bottles.  Recommended pt discard any recently used eye drop containers for they may be contaminated and to begin using a new one.  Will provide prescription for erythromycin ointment to treat conjunctivitis.  Pt already has follow up with ophthalmology in the next few days, for which I recommended keeping and utilizing. ? ?Emergency department workup does not suggest an emergent condition requiring admission or immediate intervention beyond  what has been performed at this time.  The patient is safe for discharge and has been instructed to return immediately for worsening symptoms, change in symptoms or any other concerns ? ?Disposition: ?I discussed the patient and their case  with my attending, Dr. Renaye Rakers, who agreed with the proposed treatment course.  After consideration of the diagnostic results and the patient's response to treatment, I feel that the patient would benefit from outpatient antibiotic treatment and continue with ophthalmology as planned.  Discussed course of treatment thoroughly with the patient and she demonstrated understanding.  Patient in agreement and has no further questions. ? ? ?This chart was dictated using voice recognition software.  Despite best efforts to proofread,  errors can occur which can change the documentation meaning. ? ? ?  ? ? ? ? ?Final Clinical Impression(s) / ED Diagnoses ?Final diagnoses:  ?Bacterial conjunctivitis of both eyes  ? ? ?Rx / DC Orders ?ED Discharge Orders   ? ?      Ordered  ?  erythromycin ophthalmic ointment  Status:  Discontinued       ? 02/14/22 2133  ?  erythromycin ophthalmic ointment  Status:  Discontinued       ? 02/14/22 2137  ?  erythromycin ophthalmic ointment       ? 02/14/22 2146  ? ?  ?  ? ?  ? ? ?  ?Cecil Cobbs, PA-C ?02/16/22 0248 ? ?  ?Terald Sleeper, MD ?02/16/22 1421 ? ?

## 2022-02-15 NOTE — ED Notes (Signed)
Open chart to clarify question for pharmacy on filling medication ?

## 2022-08-20 ENCOUNTER — Other Ambulatory Visit (HOSPITAL_BASED_OUTPATIENT_CLINIC_OR_DEPARTMENT_OTHER): Payer: Self-pay | Admitting: Family Medicine

## 2022-08-20 DIAGNOSIS — M810 Age-related osteoporosis without current pathological fracture: Secondary | ICD-10-CM

## 2022-09-02 ENCOUNTER — Other Ambulatory Visit (HOSPITAL_BASED_OUTPATIENT_CLINIC_OR_DEPARTMENT_OTHER): Payer: Self-pay | Admitting: Family Medicine

## 2022-09-02 DIAGNOSIS — Z1231 Encounter for screening mammogram for malignant neoplasm of breast: Secondary | ICD-10-CM

## 2022-09-07 ENCOUNTER — Inpatient Hospital Stay (HOSPITAL_BASED_OUTPATIENT_CLINIC_OR_DEPARTMENT_OTHER): Admission: RE | Admit: 2022-09-07 | Payer: 59 | Source: Ambulatory Visit

## 2022-09-13 ENCOUNTER — Other Ambulatory Visit (HOSPITAL_BASED_OUTPATIENT_CLINIC_OR_DEPARTMENT_OTHER): Payer: 59

## 2022-09-13 ENCOUNTER — Ambulatory Visit (HOSPITAL_BASED_OUTPATIENT_CLINIC_OR_DEPARTMENT_OTHER): Payer: 59

## 2022-09-21 ENCOUNTER — Ambulatory Visit (HOSPITAL_BASED_OUTPATIENT_CLINIC_OR_DEPARTMENT_OTHER)
Admission: RE | Admit: 2022-09-21 | Discharge: 2022-09-21 | Disposition: A | Discharge status: Home / Self Care | Payer: 59 | Source: Ambulatory Visit | Attending: Family Medicine | Admitting: Family Medicine

## 2022-09-21 DIAGNOSIS — M810 Age-related osteoporosis without current pathological fracture: Secondary | ICD-10-CM

## 2022-09-21 DIAGNOSIS — Z1231 Encounter for screening mammogram for malignant neoplasm of breast: Secondary | ICD-10-CM

## 2022-10-25 ENCOUNTER — Ambulatory Visit (HOSPITAL_BASED_OUTPATIENT_CLINIC_OR_DEPARTMENT_OTHER): Payer: 59

## 2022-10-26 ENCOUNTER — Other Ambulatory Visit (HOSPITAL_BASED_OUTPATIENT_CLINIC_OR_DEPARTMENT_OTHER): Payer: 59

## 2022-10-26 ENCOUNTER — Ambulatory Visit (HOSPITAL_BASED_OUTPATIENT_CLINIC_OR_DEPARTMENT_OTHER): Payer: 59

## 2022-11-09 ENCOUNTER — Ambulatory Visit (HOSPITAL_BASED_OUTPATIENT_CLINIC_OR_DEPARTMENT_OTHER)
Admission: RE | Admit: 2022-11-09 | Discharge: 2022-11-09 | Disposition: A | Discharge status: Home / Self Care | Payer: 59 | Source: Ambulatory Visit | Attending: Family Medicine | Admitting: Family Medicine

## 2022-11-09 ENCOUNTER — Encounter (HOSPITAL_BASED_OUTPATIENT_CLINIC_OR_DEPARTMENT_OTHER): Payer: Self-pay

## 2022-11-09 DIAGNOSIS — Z1231 Encounter for screening mammogram for malignant neoplasm of breast: Secondary | ICD-10-CM | POA: Insufficient documentation

## 2022-11-09 DIAGNOSIS — M810 Age-related osteoporosis without current pathological fracture: Secondary | ICD-10-CM | POA: Diagnosis present

## 2022-11-16 ENCOUNTER — Ambulatory Visit (HOSPITAL_BASED_OUTPATIENT_CLINIC_OR_DEPARTMENT_OTHER): Payer: 59

## 2022-11-16 ENCOUNTER — Other Ambulatory Visit (HOSPITAL_BASED_OUTPATIENT_CLINIC_OR_DEPARTMENT_OTHER): Payer: 59

## 2024-09-05 ENCOUNTER — Other Ambulatory Visit (HOSPITAL_BASED_OUTPATIENT_CLINIC_OR_DEPARTMENT_OTHER): Payer: Self-pay | Admitting: Family Medicine

## 2024-09-05 DIAGNOSIS — Z13820 Encounter for screening for osteoporosis: Secondary | ICD-10-CM

## 2024-09-06 ENCOUNTER — Telehealth (HOSPITAL_BASED_OUTPATIENT_CLINIC_OR_DEPARTMENT_OTHER): Payer: Self-pay

## 2024-09-07 ENCOUNTER — Other Ambulatory Visit (HOSPITAL_BASED_OUTPATIENT_CLINIC_OR_DEPARTMENT_OTHER): Payer: Self-pay | Admitting: Family Medicine

## 2024-09-07 DIAGNOSIS — Z139 Encounter for screening, unspecified: Secondary | ICD-10-CM

## 2024-09-26 ENCOUNTER — Other Ambulatory Visit: Payer: Self-pay

## 2024-09-26 ENCOUNTER — Emergency Department (HOSPITAL_COMMUNITY)
Admission: EM | Admit: 2024-09-26 | Discharge: 2024-09-27 | Disposition: A | Discharge status: Home / Self Care | Attending: Emergency Medicine | Admitting: Emergency Medicine

## 2024-09-26 ENCOUNTER — Ambulatory Visit
Admission: EM | Admit: 2024-09-26 | Discharge: 2024-09-26 | Disposition: A | Discharge status: Home / Self Care | Attending: Nurse Practitioner | Admitting: Nurse Practitioner

## 2024-09-26 ENCOUNTER — Emergency Department (HOSPITAL_COMMUNITY)

## 2024-09-26 ENCOUNTER — Encounter (HOSPITAL_COMMUNITY): Payer: Self-pay | Admitting: Emergency Medicine

## 2024-09-26 DIAGNOSIS — R42 Dizziness and giddiness: Secondary | ICD-10-CM

## 2024-09-26 DIAGNOSIS — Z79899 Other long term (current) drug therapy: Secondary | ICD-10-CM | POA: Diagnosis not present

## 2024-09-26 DIAGNOSIS — R030 Elevated blood-pressure reading, without diagnosis of hypertension: Secondary | ICD-10-CM | POA: Insufficient documentation

## 2024-09-26 DIAGNOSIS — I160 Hypertensive urgency: Secondary | ICD-10-CM | POA: Diagnosis not present

## 2024-09-26 LAB — CBC WITH DIFFERENTIAL/PLATELET
Abs Immature Granulocytes: 0.01 K/uL (ref 0.00–0.07)
Basophils Absolute: 0.1 K/uL (ref 0.0–0.1)
Basophils Relative: 1 %
Eosinophils Absolute: 0.5 K/uL (ref 0.0–0.5)
Eosinophils Relative: 7 %
HCT: 41.7 % (ref 36.0–46.0)
Hemoglobin: 14.3 g/dL (ref 12.0–15.0)
Immature Granulocytes: 0 %
Lymphocytes Relative: 29 %
Lymphs Abs: 1.8 K/uL (ref 0.7–4.0)
MCH: 33.5 pg (ref 26.0–34.0)
MCHC: 34.3 g/dL (ref 30.0–36.0)
MCV: 97.7 fL (ref 80.0–100.0)
Monocytes Absolute: 0.6 K/uL (ref 0.1–1.0)
Monocytes Relative: 10 %
Neutro Abs: 3.4 K/uL (ref 1.7–7.7)
Neutrophils Relative %: 53 %
Platelets: 201 K/uL (ref 150–400)
RBC: 4.27 MIL/uL (ref 3.87–5.11)
RDW: 12.3 % (ref 11.5–15.5)
Smear Review: NORMAL
WBC: 6.4 K/uL (ref 4.0–10.5)
nRBC: 0 % (ref 0.0–0.2)

## 2024-09-26 LAB — POCT URINE DIPSTICK
Bilirubin, UA: NEGATIVE
Blood, UA: NEGATIVE
Glucose, UA: NEGATIVE mg/dL
Ketones, POC UA: NEGATIVE mg/dL
Nitrite, UA: NEGATIVE
POC PROTEIN,UA: NEGATIVE
Spec Grav, UA: 1.015 (ref 1.010–1.025)
Urobilinogen, UA: 0.2 U/dL
pH, UA: 6 (ref 5.0–8.0)

## 2024-09-26 LAB — BASIC METABOLIC PANEL WITH GFR
Anion gap: 9 (ref 5–15)
BUN: 16 mg/dL (ref 8–23)
CO2: 24 mmol/L (ref 22–32)
Calcium: 9.2 mg/dL (ref 8.9–10.3)
Chloride: 104 mmol/L (ref 98–111)
Creatinine, Ser: 0.74 mg/dL (ref 0.44–1.00)
GFR, Estimated: >60 mL/min (ref >60)
Glucose, Bld: 98 mg/dL (ref 70–99)
Potassium: 4.3 mmol/L (ref 3.5–5.1)
Sodium: 137 mmol/L (ref 135–145)

## 2024-09-26 LAB — GLUCOSE, POCT (MANUAL RESULT ENTRY): POC Glucose: 92 mg/dL (ref 70–99)

## 2024-09-26 NOTE — ED Provider Triage Note (Signed)
 Emergency Medicine Provider Triage Evaluation Note  GILIANA VANTIL , a 73 y.o. female  was evaluated in triage.  Pt complains of from urgent care for dizziness with lateral nystagmus for central nervous system cause rule out.  Review of Systems  Positive: Dizziness acute stroke, both cough Negative: Head injury, fever, chill, nausea, vomiting, numbness, weakness  Physical Exam  BP (!) 155/84 (BP Location: Right Arm)   Pulse 64   Temp 97.8 F (36.6 C)   Resp 16   SpO2 99%  Gen:   Awake, no distress   Resp:  Normal effort  MSK:   Moves extremities without difficulty  Other:  Lateral nystagmus on exam, Romberg negative  Medical Decision Making  Medically screening exam initiated at 9:47 PM.  Appropriate orders placed.  LORRA FREEMAN was informed that the remainder of the evaluation will be completed by another provider, this initial triage assessment does not replace that evaluation, and the importance of remaining in the ED until their evaluation is complete.  Labs and imaging ordered   Francis Ileana LOISE DEVONNA 09/26/24 2153

## 2024-09-26 NOTE — ED Triage Notes (Signed)
 Pt presents with complaints of dizziness x 2-3 days and left ear pain x 1 week. Currently rates overall pain a 2/10. No medications taken PTA. No fevers. Denies sick contacts. Mentions concerns regarding her blood pressure, has been running high. BP in triage is 154/88.

## 2024-09-26 NOTE — Discharge Instructions (Addendum)
 Today you were evaluated for dizziness and left ear discomfort. Your exam showed normal ears and hearing, but you did have abnormal eye movements called nystagmus, which can happen with vertigo. Your blood sugar and urine tests were normal, and your orthostatic vitals did not show dehydration or low blood pressure. However, your blood pressure continued to rise during the visit even though you do not have a history of hypertension. Because dizziness combined with increasing blood pressure can sometimes signal a more serious problem--such as severe blood pressure elevation or, rarely, a type of stroke affecting balance--it is safest for you to be evaluated further in the emergency department.  You will need to go to the ER today for additional testing that cannot be done in urgent care, such as a CT scan or MRI, heart monitoring, and blood pressure management if needed. While waiting to be seen, try to avoid quick head movements and get up slowly to prevent worsening dizziness. Your daughter will come pick you up and drive you to the ED.

## 2024-09-26 NOTE — ED Notes (Addendum)
 Patient is being discharged from the Urgent Care and sent to the Emergency Department via private vehicle with daughter/significant other . Per Lucie HERO NP, patient is in need of higher level of care due to hypertension and reported dizziness x 2-3 days. Patient is aware and verbalizes understanding of plan of care.   Vitals:   09/26/24 1935  BP: (!) 154/88  Pulse: 72  Resp: 18  Temp: 97.9 F (36.6 C)  SpO2: 96%

## 2024-09-26 NOTE — ED Provider Notes (Signed)
 [facility]    CSN: [id number] Arrival date & time: 09/26/24  1754      History   Chief Complaint Chief Complaint  Patient presents with   Dizziness   Otalgia    HPI Alexandria Lee is a 73 y.o. female.   Discussed the use of AI scribe software for clinical note transcription with the patient, who gave verbal consent to proceed.   The patient presents with dizziness over the past couple of days and left ear pain for approximately one week, raising concern for a possible ear infection. The right ear is asymptomatic. They describe a sensation of clogged ears with occasional popping. Hearing remains intact, and they deny any ringing in the ears. The patient reports no runny nose, nasal congestion, or sore throat, and denies fevers, cough, nausea, vomiting, diarrhea, or headaches.  The patient has known allergies to grass and trees, with fall serving as a significant trigger for ragweed symptoms. They take Allegra as needed for allergy relief. They deny chest pain, shortness of breath, palpitations, or a racing heartbeat. The patient recently had a physical exam with their primary care provider, and blood work from that visit was reported as normal.    Past Medical History:  Diagnosis Date   ADD (attention deficit disorder with hyperactivity)    Allergic rhinoconjunctivitis    Bronchitis    CLEAR AND OCC YELLOW STARTED Z PACK 05-16-16 X 5 DAYS   History of kidney stones YRS AGO   Mitral valve prolapse    MILD, NO CARDIOLOGIST    There are no active problems to display for this patient.   Past Surgical History:  Procedure Laterality Date   CATARACT EXTRACTION, BILATERAL     RIGHT LEG VARICOSE VEIN SURGEYR  2006   TUBAL LIGATION      OB History   No obstetric history on file.      Home Medications    Prior to Admission medications   Medication Sig Start Date End Date Taking? Authorizing Provider  Calcium 600-200 MG-UNIT tablet Take 1 tablet by mouth  daily.    [provider]  cholecalciferol (VITAMIN D) 1000 UNITS tablet Take 1,000 Units by mouth daily.    [provider]  erythromycin  ophthalmic ointment Place a 1/2 inch ribbon of ointment into the lower eyelid. 02/14/22   Renae Bernarda CHRISTELLA, PA-C  fexofenadine (ALLEGRA) 180 MG tablet Take 180 mg by mouth daily.    [provider]  NASAL SALINE NA Place into the nose.    [provider]  olopatadine  (PATANOL) 0.1 % ophthalmic solution instill 1 drop in both eyes 2 times daily 08/24/18   Kozlow, Camellia PARAS, MD    Family History History reviewed. No pertinent family history.  Social History Social History   Tobacco Use   Smoking status: Never   Smokeless tobacco: Never  Vaping Use   Vaping status: Never Used  Substance Use Topics   Alcohol use: No   Drug use: No     Allergies   Adderall [amphetamine-dextroamphetamine]   Review of Systems Review of Systems  Constitutional:  Negative for fever.  HENT:  Positive for congestion and ear pain (left). Negative for hearing loss, rhinorrhea, sore throat and tinnitus.   Respiratory:  Positive for cough (very mild, more due to allergies). Negative for shortness of breath.   Cardiovascular:  Negative for chest pain, palpitations and leg swelling.  Gastrointestinal:  Negative for diarrhea, nausea and vomiting.  Neurological:  Positive for  dizziness. Negative for headaches.  All other systems reviewed and are negative.    Physical Exam Triage Vital Signs ED Triage Vitals  Encounter Vitals Group     BP 09/26/24 1935 (!) 154/88     Girls Systolic BP Percentile --      Girls Diastolic BP Percentile --      Boys Systolic BP Percentile --      Boys Diastolic BP Percentile --      Pulse Rate 09/26/24 1935 72     Resp 09/26/24 1935 18     Temp 09/26/24 1935 97.9 F (36.6 C)     Temp Source 09/26/24 1935 Oral     SpO2 09/26/24 1935 96 %     Weight 09/26/24 1936 130 lb (59 kg)     Height 09/26/24  1936 5' 7 (1.702 m)     Head Circumference --      Peak Flow --      Pain Score 09/26/24 1936 2     Pain Loc --      Pain Education --      Exclude from Growth Chart --    Orthostatic VS for the past 24 hrs:  BP- Lying Pulse- Lying BP- Sitting Pulse- Sitting BP- Standing at 0 minutes Pulse- Standing at 0 minutes  09/26/24 1956 171/77 57 183/83 65 175/85 73    Updated Vital Signs BP (!) 154/88 (BP Location: Right Arm)   Pulse 72   Temp 97.9 F (36.6 C) (Oral)   Resp 18   Ht 5' 7 (1.702 m)   Wt 130 lb (59 kg)   SpO2 96%   BMI 20.36 kg/m   Visual Acuity Right Eye Distance:   Left Eye Distance:   Bilateral Distance:    Right Eye Near:   Left Eye Near:    Bilateral Near:     Physical Exam Vitals reviewed.  Constitutional:      General: She is awake. She is not in acute distress.    Appearance: Normal appearance. She is well-developed. She is not ill-appearing, toxic-appearing or diaphoretic.  HENT:     Head: Normocephalic.     Right Ear: Hearing, tympanic membrane, ear canal and external ear normal. No drainage, swelling or tenderness. No middle ear effusion. Tympanic membrane is not erythematous.     Left Ear: Hearing, tympanic membrane, ear canal and external ear normal. No drainage, swelling or tenderness.  No middle ear effusion. Tympanic membrane is not erythematous.     Nose: Nose normal.     Mouth/Throat:     Mouth: Mucous membranes are moist.     Pharynx: Oropharynx is clear. Uvula midline.  Eyes:     General: Vision grossly intact.     Extraocular Movements: Extraocular movements intact.     Right eye: Normal extraocular motion.     Left eye: Normal extraocular motion.     Conjunctiva/sclera: Conjunctivae normal.     Pupils: Pupils are equal, round, and reactive to light.     Visual Fields: Right eye visual fields normal and left eye visual fields normal.     Comments: Lateral nystagmus is noted during extraocular movement testing. No vertical nystagmus    Cardiovascular:     Rate and Rhythm: Normal rate and regular rhythm.     Pulses: Normal pulses.     Heart sounds: Normal heart sounds.  Pulmonary:     Effort: Pulmonary effort is normal.     Breath sounds: Normal breath sounds and air  entry.  Abdominal:     Palpations: Abdomen is soft.  Musculoskeletal:        General: Normal range of motion.     Cervical back: Full passive range of motion without pain, normal range of motion and neck supple. No rigidity or tenderness.     Right lower leg: No edema.     Left lower leg: No edema.  Lymphadenopathy:     Cervical: No cervical adenopathy.  Skin:    General: Skin is warm and dry.  Neurological:     General: No focal deficit present.     Mental Status: She is alert and oriented to person, place, and time.     Cranial Nerves: No cranial nerve deficit.     Sensory: Sensation is intact. No sensory deficit.     Motor: Motor function is intact. No weakness.     Coordination: Coordination is intact.     Gait: Gait is intact.     Comments: No focal deficits, normal coordination, normal strength, and steady gait. Sensation is intact throughout. Dix-Hallpike maneuver is negative bilaterally.  Psychiatric:        Attention and Perception: Attention and perception normal.        Mood and Affect: Mood and affect normal.        Speech: Speech normal.        Behavior: Behavior is cooperative.        Thought Content: Thought content normal.        Cognition and Memory: Cognition normal.      UC Treatments / Results  Labs (all labs ordered are listed, but only abnormal results are displayed) Labs Reviewed  POCT URINE DIPSTICK - Abnormal; Notable for the following components:      Result Value   Leukocytes, UA Trace (*)    All other components within normal limits  GLUCOSE, POCT (MANUAL RESULT ENTRY) - Normal    EKG   Radiology No results found.  Procedures Procedures (including critical care time)  Medications Ordered in  UC Medications - No data to display  Initial Impression / Assessment and Plan / UC Course  I have reviewed the triage vital signs and the nursing notes.  Pertinent labs & imaging results that were available during my care of the patient were reviewed by me and considered in my medical decision making (see chart for details).  The patient presents with several days of dizziness and one week of left ear pain, without accompanying upper respiratory symptoms. Examination shows normal ear appearance and intact hearing with no signs of otitis. Lateral nystagmus is present on examination, suggesting a vestibular source of dizziness. Dix-Hallpike maneuver is negative bilaterally, and the patient displays no focal neurological deficits, with normal coordination, intact strength and sensation, and a steady gait. Blood glucose and urinalysis are unremarkable. Orthostatic testing does not demonstrate hypotension; however, blood pressure progressively increased throughout the visit from an initial reading of 154/88 to 183/83, despite no prior history of hypertension.  Although the patient's dizziness may be related to benign vertigo potentially triggered by seasonal allergies, the combination of persistent dizziness, abnormal nystagmus, and increasingly elevated blood pressure raises concern for symptomatic hypertensive urgency or a possible vestibular stroke, as central causes can mimic benign vertigo. Given these findings, further evaluation in the emergency department is warranted to rule out neurologic, cardiovascular, or other emergent conditions. ED transfer is recommended for advanced assessment and management. Patient agreeable.   The patient presents with symptoms and exam findings that  warrant a higher level of care and further evaluation. Given the clinical picture, emergency department (ED) assessment is recommended for advanced diagnostics and management. The patient is currently stable and appropriate  for transport via private vehicle. A responsible adult (daughter, Bari) will be driving. The patient is alert, oriented, demonstrates clear mental status, good insight into their illness, and sound judgment in making decisions regarding their care. The risks, rationale, and need for ED evaluation were discussed, and the patient is agreeable to the plan.  Documentation was completed with the aid of voice recognition software. Transcription may contain typographical errors.   Final Clinical Impressions(s) / UC Diagnoses   Final diagnoses:  Dizziness  Hypertensive urgency     Discharge Instructions      Today you were evaluated for dizziness and left ear discomfort. Your exam showed normal ears and hearing, but you did have abnormal eye movements called nystagmus, which can happen with vertigo. Your blood sugar and urine tests were normal, and your orthostatic vitals did not show dehydration or low blood pressure. However, your blood pressure continued to rise during the visit even though you do not have a history of hypertension. Because dizziness combined with increasing blood pressure can sometimes signal a more serious problem--such as severe blood pressure elevation or, rarely, a type of stroke affecting balance--it is safest for you to be evaluated further in the emergency department.  You will need to go to the ER today for additional testing that cannot be done in urgent care, such as a CT scan or MRI, heart monitoring, and blood pressure management if needed. While waiting to be seen, try to avoid quick head movements and get up slowly to prevent worsening dizziness. Your daughter will come pick you up and drive you to the ED.       ED Prescriptions   None    PDMP not reviewed this encounter.   Iola Lukes, [place] 09/26/24 2054

## 2024-09-26 NOTE — ED Triage Notes (Signed)
 Patient from the [facility] with dizziness, vertigo like symptoms for one week now intermittently. Denies chest pain, shortness of breath,  abdominal pain, n/v/d, fevers, chills. She states today while doing her exercises- doing jumping jacks she had to stop due to becoming unbalanced. States that her BP has been running high, reports systolic it usually never runs over 100. Endorses a head pressure for a few days.

## 2024-09-27 NOTE — ED Provider Notes (Signed)
 [place] [facility] AT [facility] Provider Note   CSN: [id number] Arrival date & time: 09/26/24  2119     Patient presents with: Dizziness   Alexandria Lee is a 73 y.o. female.   Patient presents to the emergency department for evaluation of elevated blood pressure.  Patient has been having intermittent episodes of dizziness for about a week.  No associated headache or vision change when this occurs.  Patient reports that she is extremely active, exercises daily and goes all day.  She had a normal routine today but then started having some dizziness, went to urgent care.  While at urgent care her blood pressure was elevated, was referred to the emergency department.  Patient does not normally have high blood pressure, in fact reports that her systolic blood pressures are normally in the 100s.       Prior to Admission medications   Medication Sig Start Date End Date Taking? Authorizing Provider  Calcium 600-200 MG-UNIT tablet Take 1 tablet by mouth daily.    [provider]  cholecalciferol (VITAMIN D) 1000 UNITS tablet Take 1,000 Units by mouth daily.    [provider]  erythromycin  ophthalmic ointment Place a 1/2 inch ribbon of ointment into the lower eyelid. 02/14/22   Renae Bernarda CHRISTELLA, PA-C  fexofenadine (ALLEGRA) 180 MG tablet Take 180 mg by mouth daily.    [provider]  NASAL SALINE NA Place into the nose.    [provider]  olopatadine  (PATANOL) 0.1 % ophthalmic solution instill 1 drop in both eyes 2 times daily 08/24/18   Kozlow, Camellia PARAS, MD    Allergies: Adderall [amphetamine-dextroamphetamine]    Review of Systems  Updated Vital Signs BP 133/66   Pulse 70   Temp 97.8 F (36.6 C)   Resp (!) 23   Ht 5' 7 (1.702 m)   Wt 59 kg   SpO2 100%   BMI 20.37 kg/m   Physical Exam Vitals and nursing note reviewed.  Constitutional:      General: She is not in acute distress.    Appearance: She is  well-developed.  HENT:     Head: Normocephalic and atraumatic.     Mouth/Throat:     Mouth: Mucous membranes are moist.  Eyes:     General: Vision grossly intact. Gaze aligned appropriately.     Extraocular Movements: Extraocular movements intact.     Conjunctiva/sclera: Conjunctivae normal.  Cardiovascular:     Rate and Rhythm: Normal rate and regular rhythm.     Pulses: Normal pulses.     Heart sounds: Normal heart sounds, S1 normal and S2 normal. No murmur heard.    No friction rub. No gallop.  Pulmonary:     Effort: Pulmonary effort is normal. No respiratory distress.     Breath sounds: Normal breath sounds.  Abdominal:     General: Bowel sounds are normal.     Palpations: Abdomen is soft.     Tenderness: There is no abdominal tenderness. There is no guarding or rebound.     Hernia: No hernia is present.  Musculoskeletal:        General: No swelling.     Cervical back: Full passive range of motion without pain, normal range of motion and neck supple. No spinous process tenderness or muscular tenderness. Normal range of motion.     Right lower leg: No edema.     Left lower leg: No edema.  Skin:    General: Skin is  warm and dry.     Capillary Refill: Capillary refill takes less than 2 seconds.     Findings: No ecchymosis, erythema, rash or wound.  Neurological:     General: No focal deficit present.     Mental Status: She is alert and oriented to person, place, and time.     GCS: GCS eye subscore is 4. GCS verbal subscore is 5. GCS motor subscore is 6.     Cranial Nerves: Cranial nerves 2-12 are intact.     Sensory: Sensation is intact.     Motor: Motor function is intact.     Coordination: Coordination is intact.  Psychiatric:        Attention and Perception: Attention normal.        Mood and Affect: Mood normal.        Speech: Speech normal.        Behavior: Behavior normal.     (all labs ordered are listed, but only abnormal results are displayed) Labs Reviewed   BASIC METABOLIC PANEL WITH GFR  CBC WITH DIFFERENTIAL/PLATELET    EKG: None  Radiology: CT Head Wo Contrast Result Date: 09/26/2024 CLINICAL DATA:  Neuro deficit. EXAM: CT HEAD WITHOUT CONTRAST TECHNIQUE: Contiguous axial images were obtained from the base of the skull through the vertex without intravenous contrast. RADIATION DOSE REDUCTION: This exam was performed according to the departmental dose-optimization program which includes automated exposure control, adjustment of the mA and/or kV according to patient size and/or use of iterative reconstruction technique. COMPARISON:  None Available. FINDINGS: Brain: There is mild cerebral atrophy with widening of the extra-axial spaces and ventricular dilatation. There are areas of mildly decreased attenuation within the white matter tracts of the supratentorial brain, consistent with microvascular disease changes. Vascular: No hyperdense vessel or unexpected calcification. Skull: Normal. Negative for fracture or focal lesion. Sinuses/Orbits: No acute finding. Other: None. IMPRESSION: No acute intracranial abnormality. Electronically Signed   By: Suzen Dials M.D.   On: 09/26/2024 23:25     Procedures   Medications Ordered in the ED - No data to display                                  Medical Decision Making  Differential Diagnosis considered includes, but not limited to: Stroke; TIA; vertebrobasilar insufficiency; peripheral vertigo; orthostatic hypotension   Presents to the emergency department for evaluation of dizziness.  Patient was found to have high blood pressure at urgent care and was referred to the emergency department.  Patient underwent CT head and lab work which is all unremarkable.  She reports that she feels well during the period of evaluation in the exam room.  Vital signs are normal other than very slightly elevated blood pressure.  She is not orthostatic.  Patient is awake, alert, appears well.  She is without  symptoms currently.  No neurologic findings on exam.  Given her history of somewhat low blood pressures, this will need to be reevaluated but I do not feel that she requires treatment at this time.  She will take a daily blood pressure check and schedule follow-up with her primary care doctor for recheck.     Final diagnoses:  Dizziness  Elevated blood pressure reading without diagnosis of hypertension    ED Discharge Orders     None          Corlene Sabia, Lonni PARAS, MD 09/27/24 [phone]

## 2024-09-27 NOTE — Discharge Instructions (Signed)
 Please schedule follow-up with your doctor for an in office blood pressure check.  Take your blood pressure once daily and write the numbers down, bring these readings with you to the office visit.  If you develop significantly elevated blood pressure similar to today, especially if there is headache, vision change or dizziness, return to the emergency department.

## 2024-10-18 ENCOUNTER — Other Ambulatory Visit: Payer: Self-pay | Admitting: Family Medicine

## 2024-10-18 DIAGNOSIS — R413 Other amnesia: Secondary | ICD-10-CM

## 2024-11-14 ENCOUNTER — Ambulatory Visit (HOSPITAL_BASED_OUTPATIENT_CLINIC_OR_DEPARTMENT_OTHER)
Admission: RE | Admit: 2024-11-14 | Discharge: 2024-11-14 | Disposition: A | Discharge status: Home / Self Care | Source: Ambulatory Visit | Attending: Family Medicine | Admitting: Family Medicine

## 2024-11-14 ENCOUNTER — Encounter (HOSPITAL_BASED_OUTPATIENT_CLINIC_OR_DEPARTMENT_OTHER): Payer: Self-pay

## 2024-11-14 DIAGNOSIS — Z13820 Encounter for screening for osteoporosis: Secondary | ICD-10-CM | POA: Diagnosis present

## 2024-11-14 DIAGNOSIS — Z1231 Encounter for screening mammogram for malignant neoplasm of breast: Secondary | ICD-10-CM | POA: Insufficient documentation

## 2024-11-14 DIAGNOSIS — Z139 Encounter for screening, unspecified: Secondary | ICD-10-CM

## 2024-11-24 ENCOUNTER — Other Ambulatory Visit
